# Patient Record
Sex: Female | Born: 1965 | Race: White | Hispanic: No | Smoking: Former smoker
Health system: Southern US, Community
[De-identification: ages and names within clinical notes are randomized; demographics above are authoritative.]

## PROBLEM LIST (undated history)

## (undated) DIAGNOSIS — Z309 Encounter for contraceptive management, unspecified: Secondary | ICD-10-CM

## (undated) DIAGNOSIS — K602 Anal fissure, unspecified: Secondary | ICD-10-CM

## (undated) DIAGNOSIS — R52 Pain, unspecified: Secondary | ICD-10-CM

## (undated) DIAGNOSIS — R42 Dizziness and giddiness: Secondary | ICD-10-CM

## (undated) DIAGNOSIS — K649 Unspecified hemorrhoids: Secondary | ICD-10-CM

## (undated) DIAGNOSIS — N390 Urinary tract infection, site not specified: Secondary | ICD-10-CM

## (undated) HISTORY — PX: COLONOSCOPY: SHX174

## (undated) HISTORY — DX: Unspecified hemorrhoids: K64.9

## (undated) HISTORY — DX: Encounter for contraceptive management, unspecified: Z30.9

## (undated) HISTORY — DX: Pain, unspecified: R52

## (undated) HISTORY — DX: Anal fissure, unspecified: K60.2

## (undated) HISTORY — DX: Urinary tract infection, site not specified: N39.0

## (undated) HISTORY — DX: Dizziness and giddiness: R42

---

## 2001-02-12 ENCOUNTER — Emergency Department (HOSPITAL_COMMUNITY): Admission: EM | Admit: 2001-02-12 | Discharge: 2001-02-13 | Payer: Self-pay | Admitting: Emergency Medicine

## 2001-02-12 ENCOUNTER — Encounter: Payer: Self-pay | Admitting: Emergency Medicine

## 2002-02-01 ENCOUNTER — Emergency Department (HOSPITAL_COMMUNITY): Admission: EM | Admit: 2002-02-01 | Discharge: 2002-02-01 | Payer: Self-pay | Admitting: Emergency Medicine

## 2004-12-12 ENCOUNTER — Emergency Department (HOSPITAL_COMMUNITY): Admission: EM | Admit: 2004-12-12 | Discharge: 2004-12-13 | Payer: Self-pay | Admitting: Emergency Medicine

## 2006-04-24 ENCOUNTER — Ambulatory Visit: Payer: Self-pay | Admitting: Gastroenterology

## 2006-05-09 ENCOUNTER — Ambulatory Visit: Payer: Self-pay | Admitting: Gastroenterology

## 2006-05-09 ENCOUNTER — Ambulatory Visit (HOSPITAL_COMMUNITY): Admission: RE | Admit: 2006-05-09 | Discharge: 2006-05-09 | Payer: Self-pay | Admitting: Gastroenterology

## 2007-09-16 ENCOUNTER — Other Ambulatory Visit: Admission: RE | Admit: 2007-09-16 | Discharge: 2007-09-16 | Payer: Self-pay | Admitting: Obstetrics and Gynecology

## 2008-09-16 ENCOUNTER — Other Ambulatory Visit: Admission: RE | Admit: 2008-09-16 | Discharge: 2008-09-16 | Payer: Self-pay | Admitting: Obstetrics and Gynecology

## 2009-03-31 ENCOUNTER — Ambulatory Visit (HOSPITAL_COMMUNITY): Admission: RE | Admit: 2009-03-31 | Discharge: 2009-03-31 | Payer: Self-pay | Admitting: Family Medicine

## 2009-10-04 ENCOUNTER — Other Ambulatory Visit: Admission: RE | Admit: 2009-10-04 | Discharge: 2009-10-04 | Payer: Self-pay | Admitting: Obstetrics and Gynecology

## 2009-11-26 ENCOUNTER — Ambulatory Visit (HOSPITAL_COMMUNITY): Admission: RE | Admit: 2009-11-26 | Discharge: 2009-11-26 | Payer: Self-pay | Admitting: Obstetrics & Gynecology

## 2009-12-22 ENCOUNTER — Ambulatory Visit (HOSPITAL_COMMUNITY)
Admission: RE | Admit: 2009-12-22 | Discharge: 2009-12-22 | Payer: Self-pay | Source: Home / Self Care | Attending: Obstetrics & Gynecology | Admitting: Obstetrics & Gynecology

## 2010-02-06 ENCOUNTER — Encounter: Payer: Self-pay | Admitting: Family Medicine

## 2010-06-03 NOTE — Consult Note (Signed)
NAME:  Flott, Latrece                ACCOUNT NO.:  [id number]   MEDICAL RECORD NO.:  [id number]           PATIENT TYPE:  AMB   LOCATION:                                FACILITY:  [facility]   PHYSICIAN:  Kassie Mends, M.D.      DATE OF BIRTH:  [birth year]   DATE OF CONSULTATION:  DATE OF DISCHARGE:                                 CONSULTATION   REQUESTING PHYSICIAN:  Dr. Lilyan Punt.   CHIEF COMPLAINT:  Intermittent hematochezia, proctalgia.   HISTORY OF PRESENT ILLNESS:  [name] is a 45 year old Caucasian  female who, over the last 3 years, has noticed intermittent small volume  hematochezia.  She says about 3 weeks ago, however, she developed severe  proctalgia.  She describes it as a ripping type pain with bowel  movement.  She was also having low abdominal sharp bilateral pelvic  pains which radiated to the rectum.  She says the pain resolved post  defecation.  She did notice small-volume pink blood on the toilet paper  with wiping.  She occasionally has hard stools.  Denies any straining.  She is having about two stools per day.  Her hemoglobin was checked and  normal at Dr. Fletcher Anon office per patient.  She denies any NSAID or  aspirin use.   PAST MEDICAL HISTORY:  Denies any past surgical history.  Denies any  current medications, Ortho Evra patch.   ALLERGIES:  KEFLEX, LEVAQUIN, CODEINE AND PENICILLIN.   FAMILY HISTORY:  There is no known family history of colorectal  carcinoma or inflammatory bowel disease.   SOCIAL HISTORY:  Ms. Ramaker is married.  She has one healthy son.  She  works at [employer].  She has a remote history tobacco use.  Denies  any alcohol or drug use.   REVIEW OF SYSTEMS:  CONSTITUTION:  Weight is stable.  Denies any fever  or chills.  Denies any fatigue.  CARDIOVASCULAR:  Denies chest pain or  palpitations.  PULMONARY:  Denies shortness of breath, dyspnea, cough,  hemoptysis.  GI:  See HPI.  Denies any nausea, vomiting, heartburn,  indigestion,  dysphagia, odynophagia, anorexia or early satiety.  HEMATOLOGICAL:  Denies any history of easy bruising, bleeding or blood  dyscrasias.  She does have a remote history of anemia, which was short-  lived, and she is unsure as to etiology why.  This was years ago.   PHYSICAL EXAM:  VITAL SIGNS:  Weight 137 pounds, height 61 inches,  temperature 98.4, blood pressure 112/82, and pulse 78.  GENERAL:  [name] [name] is a well-developed, well-nourished Caucasian  female in no acute distress.  HEENT:  Pupils are clear, nonicteric.  Conjunctivae pink.  Oropharynx pink and moist without lesions.  NECK:  Supple with no mass or thyromegaly.  CHEST:  Heart regular rate and rhythm, normal S1, S2, without murmurs,  clicks, rubs or gallops.  LUNGS:  Clear to auscultation bilaterally.  ABDOMEN:  Positive bowel sounds x4.  No bruits auscultated.  Soft,  nontender, nondistended,  without palpable mass or hepatosplenomegaly.  No rebound tenderness or guarding.  EXTREMITIES:  Without clubbing or edema bilaterally.  SKIN:  Pink, warm, and dry, without any rash or jaundice.   IMPRESSION:  [name] [name] is a 45 year old Caucasian female who has had a  3-year history of intermittent small volume hematochezia.  She now  presents with severe proctalgia with defecation as well as some  bilateral low abdominal sharp, stabbing pains.  She is going to require  further evaluation given her chronic, intermittent hematochezia to rule  out colorectal carcinoma, although it is possible she could have benign  anorectal source of her bleeding including hemorrhoid or fissure.   PLAN:  Colonoscopy with Dr. Cira Servant in the near future.  Has discussed  this procedure including risks and benefits including but not limited to  bleeding, infection, perforation, drug reaction.  She agrees to the plan  and consent will be obtained.   I would like to thank Dr. Gerda Diss for allowing [place] to participate in the  care of Ms. Gries.       Nicholas Lose, N.P.      Kassie Mends, M.D.  Electronically Signed    KC/MEDQ  D:  04/24/2006  T:  04/24/2006  Job:  [id number]   cc:   Lorin Picket A. Gerda Diss, MD  Fax: [phone]

## 2010-06-03 NOTE — Op Note (Signed)
NAME:  [name] [name]                ACCOUNT NO.:  [id number]   MEDICAL RECORD NO.:  [id number]          PATIENT TYPE:  AMB   LOCATION:  DAY                           FACILITY:  [facility]   PHYSICIAN:  Kassie Mends, M.D.      DATE OF BIRTH:  1965-02-25   DATE OF PROCEDURE:  05/09/2006  DATE OF DISCHARGE:                               OPERATIVE REPORT   PROCEDURE:  Colonoscopy.   ENDOSCOPIST:  Kassie Mends, M.D.   INDICATIONS FOR PROCEDURE:  [name] [name] is a 45 year old female who  presents with intermittent rectal bleeding and rectal pain, as well as  rectal itching.  Her symptoms developed at the beginning of April.   FINDINGS:  1. Normal colon without evidence of polyps, masses, inflammatory      changes, diverticula or arteriovenous malformations.  2. Normal retroflexed view of the rectum without evidence of internal      hemorrhoids.  The retroflexed view needed to be performed with a      diagnostic gastroscope due to a short rectum.   RECOMMENDATIONS:  1. [name] [name] may have intermittent rectal bleeding and rectal pain      due to an anal fissure.  She will be started on AnaMantle twice      daily for two weeks, and she may repeat if her rectal pain and      bleeding persist.  She will also be asked to use Colace 100 mg      b.i.d.  2. She will be asked to use a low-residue diet for four weeks.  She is      given a handout on a low-residue diet.  3. No aspirin or anti-inflammatory drugs for 14 days.  4. She will follow up with Dr. Kassie Mends in four weeks.  5. Screening colonoscopy in 10 years.   MEDICATIONS:  Demerol 75 mg IV, Versed 6 mg IV.   DESCRIPTION OF PROCEDURE:  Physical examination was performed and an  informed consent was obtained from the patient, after explaining the  benefits, risks and alternatives of the procedure.  The patient was  connected to the monitor and placed in the left lateral position.  Continuous oxygen provided by nasal cannula and IV  medicines  administered through an indwelling cannula.  After  administration of  sedation and rectal exam, the scope was  advanced under direct visualization to the cecum.  The scope was  subsequently removed slowly by carefully examining the color, texture,  anatomy and integrity of the mucosa on the way out.  The patient was  recovered in endoscopy and discharged home in satisfactory condition.      Kassie Mends, M.D.  Electronically Signed     SM/MEDQ  D:  05/09/2006  T:  05/09/2006  Job:  [id number]   cc:   Lorin Picket A. Gerda Diss, MD  Fax: [phone]

## 2010-11-08 ENCOUNTER — Other Ambulatory Visit (HOSPITAL_COMMUNITY)
Admission: RE | Admit: 2010-11-08 | Discharge: 2010-11-08 | Disposition: A | Discharge status: Home / Self Care | Payer: Managed Care, Other (non HMO) | Source: Ambulatory Visit | Attending: Obstetrics and Gynecology | Admitting: Obstetrics and Gynecology

## 2010-11-08 ENCOUNTER — Other Ambulatory Visit: Payer: Self-pay | Admitting: Adult Health

## 2010-11-08 DIAGNOSIS — Z01419 Encounter for gynecological examination (general) (routine) without abnormal findings: Secondary | ICD-10-CM | POA: Insufficient documentation

## 2011-11-10 ENCOUNTER — Other Ambulatory Visit: Payer: Self-pay | Admitting: Adult Health

## 2011-11-10 ENCOUNTER — Other Ambulatory Visit (HOSPITAL_COMMUNITY)
Admission: RE | Admit: 2011-11-10 | Discharge: 2011-11-10 | Disposition: A | Discharge status: Home / Self Care | Payer: Managed Care, Other (non HMO) | Source: Ambulatory Visit | Attending: Obstetrics and Gynecology | Admitting: Obstetrics and Gynecology

## 2011-11-10 DIAGNOSIS — Z1151 Encounter for screening for human papillomavirus (HPV): Secondary | ICD-10-CM | POA: Insufficient documentation

## 2011-11-10 DIAGNOSIS — Z113 Encounter for screening for infections with a predominantly sexual mode of transmission: Secondary | ICD-10-CM | POA: Insufficient documentation

## 2011-11-10 DIAGNOSIS — Z01419 Encounter for gynecological examination (general) (routine) without abnormal findings: Secondary | ICD-10-CM | POA: Insufficient documentation

## 2011-11-10 DIAGNOSIS — Z139 Encounter for screening, unspecified: Secondary | ICD-10-CM

## 2011-11-20 ENCOUNTER — Ambulatory Visit (HOSPITAL_COMMUNITY)
Admission: RE | Admit: 2011-11-20 | Discharge: 2011-11-20 | Disposition: A | Discharge status: Home / Self Care | Payer: Managed Care, Other (non HMO) | Source: Ambulatory Visit | Attending: Adult Health | Admitting: Adult Health

## 2011-11-20 DIAGNOSIS — Z1231 Encounter for screening mammogram for malignant neoplasm of breast: Secondary | ICD-10-CM | POA: Insufficient documentation

## 2011-11-20 DIAGNOSIS — Z139 Encounter for screening, unspecified: Secondary | ICD-10-CM

## 2012-08-15 ENCOUNTER — Other Ambulatory Visit: Payer: Self-pay | Admitting: Adult Health

## 2012-08-21 ENCOUNTER — Other Ambulatory Visit: Payer: Self-pay | Admitting: Adult Health

## 2012-08-28 ENCOUNTER — Other Ambulatory Visit: Payer: Self-pay | Admitting: Family Medicine

## 2012-08-28 ENCOUNTER — Encounter: Payer: Self-pay | Admitting: Advanced Practice Midwife

## 2012-08-28 ENCOUNTER — Ambulatory Visit (INDEPENDENT_AMBULATORY_CARE_PROVIDER_SITE_OTHER): Payer: Managed Care, Other (non HMO) | Admitting: Advanced Practice Midwife

## 2012-08-28 VITALS — BP 116/76 | Ht 61.0 in | Wt 130.5 lb

## 2012-08-28 DIAGNOSIS — B373 Candidiasis of vulva and vagina: Secondary | ICD-10-CM

## 2012-08-28 DIAGNOSIS — B3789 Other sites of candidiasis: Secondary | ICD-10-CM

## 2012-08-28 DIAGNOSIS — N760 Acute vaginitis: Secondary | ICD-10-CM

## 2012-08-28 DIAGNOSIS — R350 Frequency of micturition: Secondary | ICD-10-CM

## 2012-08-28 LAB — POCT URINALYSIS DIPSTICK
Blood, UA: NEGATIVE
Glucose, UA: NEGATIVE

## 2012-08-28 MED ORDER — CLINDAMYCIN PHOSPHATE 100 MG VA SUPP: 100.0000 mg | Freq: Every day | VAGINAL | Status: DC

## 2012-08-28 MED ORDER — FLUCONAZOLE 150 MG PO TABS: 150.0000 mg | ORAL_TABLET | Freq: Once | ORAL | Status: DC

## 2012-08-28 NOTE — Progress Notes (Signed)
SUBJECTIVE:  47 y.o. female complains of white, thin and vulvar erythema noted vaginal discharge for 4  day(s). Denies abnormal vaginal bleeding or significant pelvic pain or Fever. C/O dysuria/pressure which resolved today (after 3 days of macrobid). Denies history of known exposure to STD.  Patient's last menstrual period was 07/28/2012.  OBJECTIVE:  She appears well, afebrile. Abdomen: benign, soft, nontender, no masses. Pelvic Exam:Vulva very erythemous, WET MOUNT done - results: clue cells, excessive bacteria. Urine dipstick: negative for all components. Pt has rx fro Macrobid tht she started 3 days ago  ASSESSMENT:  bacterial vaginosis  PLAN:   Treatment: Cleocin Ovule at bedtime x 3 days and abstain from coitus during course of treatment.  Fininsh Macrobid;.  Prn DIFLUCAN  sent   ROV prn if symptoms persist or worsen.

## 2012-09-22 ENCOUNTER — Other Ambulatory Visit: Payer: Self-pay | Admitting: Family Medicine

## 2012-10-09 ENCOUNTER — Other Ambulatory Visit: Payer: Self-pay | Admitting: Adult Health

## 2012-11-13 ENCOUNTER — Ambulatory Visit (INDEPENDENT_AMBULATORY_CARE_PROVIDER_SITE_OTHER): Payer: Managed Care, Other (non HMO) | Admitting: Adult Health

## 2012-11-13 ENCOUNTER — Encounter (INDEPENDENT_AMBULATORY_CARE_PROVIDER_SITE_OTHER): Payer: Self-pay

## 2012-11-13 ENCOUNTER — Encounter: Payer: Self-pay | Admitting: Adult Health

## 2012-11-13 VITALS — BP 122/70 | HR 78 | Ht 60.0 in | Wt 126.0 lb

## 2012-11-13 DIAGNOSIS — Z309 Encounter for contraceptive management, unspecified: Secondary | ICD-10-CM

## 2012-11-13 DIAGNOSIS — Z01419 Encounter for gynecological examination (general) (routine) without abnormal findings: Secondary | ICD-10-CM

## 2012-11-13 DIAGNOSIS — Z1212 Encounter for screening for malignant neoplasm of rectum: Secondary | ICD-10-CM

## 2012-11-13 HISTORY — DX: Encounter for contraceptive management, unspecified: Z30.9

## 2012-11-13 LAB — HEMOCCULT GUIAC POC 1CARD (OFFICE): Fecal Occult Blood, POC: NEGATIVE

## 2012-11-13 MED ORDER — ACYCLOVIR 5 % EX CREA: 1.0000 "application " | TOPICAL_CREAM | CUTANEOUS | Status: DC

## 2012-11-13 MED ORDER — NORELGESTROMIN-ETH ESTRADIOL 150-35 MCG/24HR TD PTWK: 1.0000 | MEDICATED_PATCH | TRANSDERMAL | Status: DC

## 2012-11-13 NOTE — Progress Notes (Signed)
Patient ID: [name] [name], female   DOB: [birth year], 47 y.o.   MRN: [id number] History of Present Illness: [name] [name] is a 47 year old white female in for physical, she had a normal pap with negative HPV 11/10/11. Happy with patch.  Current Medications, Allergies, Past Medical History, Past Surgical History, Family History and Social History were reviewed in [employer] record.     Review of Systems: Patient denies any headaches, blurred vision, shortness of breath, chest pain, abdominal pain, problems with bowel movements, urination, or intercourse. No joint pain has chronic back pain, no mood swings, has noticed vaginal itch after sex and does not feel as much with sex.    Physical Exam:BP 122/70  Pulse 78  Ht 5' (1.524 m)  Wt 126 lb (57.153 kg)  BMI 24.61 kg/m2  LMP 10/20/2012 General:  Well developed, well nourished, no acute distress Skin:  Warm and dry Neck:  Midline trachea, normal thyroid Lungs; Clear to auscultation bilaterally Breast:  No dominant palpable mass, retraction, or nipple discharge Cardiovascular: Regular rate and rhythm Abdomen:  Soft, non tender, no hepatosplenomegaly Pelvic:  External genitalia is normal in appearance. Has sebaceous cyst right labia. The vagina is normal in appearance.  The cervix is bulbous.  Uterus is felt to be normal size, shape, and contour.  No   adnexal masses or tenderness noted. Rectal: Good sphincter tone, no polyps, or hemorrhoids felt.  Hemoccult negative. Extremities:  No swelling or varicosities noted Psych:  No mood changes, alert and cooperative   Impression: Yearly gyn exam no pap Contraceptive management    Plan: Physical in 1 year  Mammogram yearly Check fasting labs in near future, CBC,CMP,TSH and lipids Colonoscopy at 50 Refilled ortho evra x 1 year  Use lubricant and increase foreplay

## 2012-11-13 NOTE — Patient Instructions (Signed)
Physical in 1 year Mammogram yearly Colonoscopy at 50  

## 2012-11-26 ENCOUNTER — Other Ambulatory Visit: Payer: Self-pay | Admitting: Adult Health

## 2012-11-26 DIAGNOSIS — Z139 Encounter for screening, unspecified: Secondary | ICD-10-CM

## 2012-12-03 ENCOUNTER — Ambulatory Visit (HOSPITAL_COMMUNITY)
Admission: RE | Admit: 2012-12-03 | Discharge: 2012-12-03 | Disposition: A | Discharge status: Home / Self Care | Payer: Managed Care, Other (non HMO) | Source: Ambulatory Visit | Attending: Adult Health | Admitting: Adult Health

## 2012-12-03 DIAGNOSIS — Z1231 Encounter for screening mammogram for malignant neoplasm of breast: Secondary | ICD-10-CM | POA: Insufficient documentation

## 2012-12-03 DIAGNOSIS — Z139 Encounter for screening, unspecified: Secondary | ICD-10-CM

## 2012-12-05 ENCOUNTER — Ambulatory Visit (INDEPENDENT_AMBULATORY_CARE_PROVIDER_SITE_OTHER): Payer: Managed Care, Other (non HMO) | Admitting: Nurse Practitioner

## 2012-12-05 ENCOUNTER — Encounter: Payer: Self-pay | Admitting: Nurse Practitioner

## 2012-12-05 VITALS — BP 122/88 | Temp 98.2°F | Ht 60.0 in | Wt 126.0 lb

## 2012-12-05 DIAGNOSIS — J310 Chronic rhinitis: Secondary | ICD-10-CM

## 2012-12-05 MED ORDER — METHYLPREDNISOLONE ACETATE 40 MG/ML IJ SUSP
40.0000 mg | Freq: Once | INTRAMUSCULAR | Status: AC
  Administered 2012-12-05: 40 mg via INTRAMUSCULAR

## 2012-12-05 MED ORDER — FLUCONAZOLE 150 MG PO TABS: ORAL_TABLET | ORAL | Status: DC

## 2012-12-05 MED ORDER — AZITHROMYCIN 250 MG PO TABS: ORAL_TABLET | ORAL | Status: DC

## 2012-12-05 NOTE — Patient Instructions (Signed)
Nasacort AQ as directed OTC anthistamine

## 2012-12-07 ENCOUNTER — Encounter: Payer: Self-pay | Admitting: Nurse Practitioner

## 2012-12-07 NOTE — Progress Notes (Signed)
Subjective:  Presents with complaints of sinus congestion for the past 5 days. No fever. Frequent nonproductive cough. Green nasal drainage. No headache sore throat. Some ear pressure. No wheezing.  Objective:   BP 122/88  Temp(Src) 98.2 F (36.8 C) (Oral)  Ht 5' (1.524 m)  Wt 126 lb (57.153 kg)  BMI 24.61 kg/m2  LMP 11/18/2012 NAD. Alert, oriented. TMs clear effusion, no erythema. Pharynx injected with green PND noted. Neck supple with mild soft nontender adenopathy. Lungs clear. Heart regular rate rhythm.   Assessment:Rhinitis - Plan: methylPREDNISolone acetate (DEPO-MEDROL) injection 40 mg  Plan: Meds ordered this encounter  Medications  . DISCONTD: fluconazole (DIFLUCAN) 150 MG tablet    Sig:   . azithromycin (ZITHROMAX Z-PAK) 250 MG tablet    Sig: Take 2 tablets (500 mg) on  Day 1,  followed by 1 tablet (250 mg) once daily on Days 2 through 5.    Dispense:  6 each    Refill:  0    Order Specific Question:  Supervising Provider    Answer:  Merlyn Albert [[id number]]  . fluconazole (DIFLUCAN) 150 MG tablet    Sig: One po qd prn yeast infection; may repeat in 3-4 days if needed    Dispense:  2 tablet    Refill:  0    Order Specific Question:  Supervising Provider    Answer:  Merlyn Albert [[id number]]  . methylPREDNISolone acetate (DEPO-MEDROL) injection 40 mg    Sig:    OTC meds as directed for congestion. OTC antihistamines as directed. Nasacort AQ as directed. Call back if symptoms worsen or persist.

## 2012-12-09 ENCOUNTER — Other Ambulatory Visit: Payer: Self-pay | Admitting: Adult Health

## 2012-12-09 DIAGNOSIS — R928 Other abnormal and inconclusive findings on diagnostic imaging of breast: Secondary | ICD-10-CM

## 2012-12-17 ENCOUNTER — Other Ambulatory Visit: Payer: Self-pay | Admitting: Adult Health

## 2012-12-17 ENCOUNTER — Other Ambulatory Visit: Payer: Managed Care, Other (non HMO)

## 2012-12-17 LAB — LIPID PANEL
Cholesterol: 140 mg/dL (ref 0–200)
HDL: 58 mg/dL (ref >39)
LDL Cholesterol: 59 mg/dL (ref 0–99)
Total CHOL/HDL Ratio: 2.4 Ratio
Triglycerides: 113 mg/dL (ref <150)
VLDL: 23 mg/dL (ref 0–40)

## 2012-12-17 LAB — COMPREHENSIVE METABOLIC PANEL
Calcium: 9.3 mg/dL (ref 8.4–10.5)
Glucose, Bld: 82 mg/dL (ref 70–99)
Total Bilirubin: 0.5 mg/dL (ref 0.3–1.2)
Total Protein: 6.8 g/dL (ref 6.0–8.3)

## 2012-12-17 LAB — CBC
HCT: 37 % (ref 36.0–46.0)
Hemoglobin: 12.5 g/dL (ref 12.0–15.0)
WBC: 5 10*3/uL (ref 4.0–10.5)

## 2012-12-17 LAB — TSH: TSH: 0.602 u[IU]/mL (ref 0.350–4.500)

## 2013-01-01 ENCOUNTER — Ambulatory Visit (HOSPITAL_COMMUNITY)
Admission: RE | Admit: 2013-01-01 | Discharge: 2013-01-01 | Disposition: A | Discharge status: Home / Self Care | Payer: Managed Care, Other (non HMO) | Source: Ambulatory Visit | Attending: Adult Health | Admitting: Adult Health

## 2013-01-01 DIAGNOSIS — R928 Other abnormal and inconclusive findings on diagnostic imaging of breast: Secondary | ICD-10-CM

## 2013-03-18 ENCOUNTER — Other Ambulatory Visit: Payer: Self-pay | Admitting: Family Medicine

## 2013-03-21 ENCOUNTER — Other Ambulatory Visit: Payer: Self-pay | Admitting: Adult Health

## 2013-05-01 ENCOUNTER — Other Ambulatory Visit: Payer: Self-pay | Admitting: Family Medicine

## 2013-05-02 NOTE — Telephone Encounter (Signed)
Last seen on 11/14 for sickness

## 2013-05-04 NOTE — Telephone Encounter (Signed)
Refill x3, she will need office visit before further refills

## 2013-05-14 ENCOUNTER — Telehealth: Payer: Self-pay | Admitting: Adult Health

## 2013-05-14 MED ORDER — FLUCONAZOLE 150 MG PO TABS: ORAL_TABLET | ORAL | Status: DC

## 2013-05-14 NOTE — Telephone Encounter (Signed)
Left message refilled diflucan

## 2013-05-19 ENCOUNTER — Ambulatory Visit (INDEPENDENT_AMBULATORY_CARE_PROVIDER_SITE_OTHER): Payer: Managed Care, Other (non HMO) | Admitting: Nurse Practitioner

## 2013-05-19 ENCOUNTER — Encounter: Payer: Self-pay | Admitting: Nurse Practitioner

## 2013-05-19 VITALS — BP 128/76 | Temp 98.2°F | Ht 61.0 in | Wt 128.0 lb

## 2013-05-19 DIAGNOSIS — R102 Pelvic and perineal pain: Secondary | ICD-10-CM

## 2013-05-19 DIAGNOSIS — R300 Dysuria: Secondary | ICD-10-CM

## 2013-05-19 LAB — POCT URINALYSIS DIPSTICK
PH UA: 5
SPEC GRAV UA: 1.015

## 2013-05-19 LAB — POCT UA - MICROSCOPIC ONLY
Bacteria, U Microscopic: NEGATIVE
RBC, URINE, MICROSCOPIC: NEGATIVE

## 2013-05-19 MED ORDER — SULFAMETHOXAZOLE-TMP DS 800-160 MG PO TABS: 1.0000 | ORAL_TABLET | Freq: Two times a day (BID) | ORAL | Status: DC

## 2013-05-19 NOTE — Progress Notes (Signed)
Subjective:  Presents for complaints of urinary urgency and frequency for the past 2 weeks. Dysuria. Tenderness around the left mid back area. Has had problems off and on with urinary symptoms particularly after intercourse. Was given prescription for Macrobid to take if needed after intercourse, some relief but did not resolve symptoms this time. No relief with Diflucan. Had some external burning, used Monistat which seemed to make it worse. Patient is having some spotting today, just completed a normal menstrual cycle. Otherwise no vaginal discharge. Same sexual partner for the past 3 years, will occasionally use condoms. No fever. Nausea but no vomiting.  Objective:   BP 128/76  Temp(Src) 98.2 F (36.8 C) (Oral)  Ht 5\' 1"  (1.549 m)  Wt 128 lb (58.06 kg)  BMI 24.20 kg/m2 NAD. Alert, oriented. Lungs clear. Minimal left CVA area tenderness. Heart regular rate rhythm. Abdomen soft nondistended with mild mid pelvic area tenderness. GU exam deferred since patient is still bleeding. Results for orders placed in visit on 05/19/13  POCT URINALYSIS DIPSTICK      Result Value Ref Range   Color, UA       Clarity, UA       Glucose, UA       Bilirubin, UA +     Ketones, UA       Spec Grav, UA 1.015     Blood, UA       pH, UA 5.0     Protein, UA       Urobilinogen, UA       Nitrite, UA       Leukocytes, UA      POCT UA - MICROSCOPIC ONLY      Result Value Ref Range   WBC, Ur, HPF, POC rare     RBC, urine, microscopic neg     Bacteria, U Microscopic neg     Mucus, UA       Epithelial cells, urine per micros rare     Crystals, Ur, HPF, POC       Casts, Ur, LPF, POC       Yeast, UA         Assessment:Dysuria - Plan: POCT urinalysis dipstick, POCT UA - Microscopic Only, Urine culture, GC/chlamydia probe amp, urine  Pelvic pain - Plan: Urine culture, GC/chlamydia probe amp, urine  Plan: Meds ordered this encounter  Medications  . loratadine (CLARITIN) 10 MG tablet    Sig: Take 10 mg by  mouth daily.  . Multiple Vitamin (MULTIVITAMIN) tablet    Sig: Take 1 tablet by mouth daily.  . naproxen sodium (ANAPROX) 220 MG tablet    Sig: Take 220 mg by mouth as needed.  . sulfamethoxazole-trimethoprim (BACTRIM DS) 800-160 MG per tablet    Sig: Take 1 tablet by mouth 2 (two) times daily.    Dispense:  14 tablet    Refill:  0    Order Specific Question:  Supervising Provider    Answer:  Mikey Kirschner [[id number]]   Urine culture pending. Also urine for GC and Chlamydia due to persistent symptoms. Warning signs reviewed. Call back in 48-72 hours if no improvement, sooner if worse. Recheck with gynecology if vaginal symptoms continue.

## 2013-05-20 LAB — GC/CHLAMYDIA PROBE AMP, URINE
CHLAMYDIA, SWAB/URINE, PCR: NEGATIVE
GC PROBE AMP, URINE: NEGATIVE

## 2013-05-22 LAB — URINE CULTURE: Colony Count: 45000

## 2013-06-03 ENCOUNTER — Other Ambulatory Visit: Payer: Self-pay | Admitting: Nurse Practitioner

## 2013-06-03 NOTE — Telephone Encounter (Signed)
Please confirm with patient why she takes Neurontin. She may have this +3 additional refills but she would need to have a followup office visit later this summer in regards to the Neurontin

## 2013-06-05 NOTE — Telephone Encounter (Signed)
May refill x3

## 2013-07-03 ENCOUNTER — Ambulatory Visit (INDEPENDENT_AMBULATORY_CARE_PROVIDER_SITE_OTHER): Payer: Managed Care, Other (non HMO) | Admitting: Family Medicine

## 2013-07-03 ENCOUNTER — Encounter: Payer: Self-pay | Admitting: Family Medicine

## 2013-07-03 VITALS — BP 120/78 | Ht 61.0 in | Wt 131.6 lb

## 2013-07-03 DIAGNOSIS — M6580 Other synovitis and tenosynovitis, unspecified site: Secondary | ICD-10-CM

## 2013-07-03 DIAGNOSIS — M722 Plantar fascial fibromatosis: Secondary | ICD-10-CM

## 2013-07-03 DIAGNOSIS — M76891 Other specified enthesopathies of right lower limb, excluding foot: Secondary | ICD-10-CM

## 2013-07-03 MED ORDER — KETOCONAZOLE 2 % EX CREA: 1.0000 "application " | TOPICAL_CREAM | Freq: Two times a day (BID) | CUTANEOUS | Status: AC

## 2013-07-03 MED ORDER — TRIAMCINOLONE ACETONIDE 0.1 % EX CREA: TOPICAL_CREAM | CUTANEOUS | Status: DC

## 2013-07-03 MED ORDER — DICLOFENAC SODIUM ER 100 MG PO TB24: 100.0000 mg | ORAL_TABLET | Freq: Every day | ORAL | Status: DC

## 2013-07-03 NOTE — Patient Instructions (Signed)
Plantar Fasciitis  Plantar fasciitis is a common condition that causes foot pain. It is soreness (inflammation) of the band of tough fibrous tissue on the bottom of the foot that runs from the heel bone (calcaneus) to the ball of the foot. The cause of this soreness may be from excessive standing, poor fitting shoes, running on hard surfaces, being overweight, having an abnormal walk, or overuse (this is common in runners) of the painful foot or feet. It is also common in aerobic exercise dancers and ballet dancers.  SYMPTOMS   Most people with plantar fasciitis complain of:   Severe pain in the morning on the bottom of their foot especially when taking the first steps out of bed. This pain recedes after a few minutes of walking.   Severe pain is experienced also during walking following a long period of inactivity.   Pain is worse when walking barefoot or up stairs  DIAGNOSIS    Your caregiver will diagnose this condition by examining and feeling your foot.   Special tests such as X-rays of your foot, are usually not needed.  PREVENTION    Consult a sports medicine professional before beginning a new exercise program.   Walking programs offer a good workout. With walking there is a lower [place] of overuse injuries common to runners. There is less impact and less jarring of the joints.   Begin all new exercise programs slowly. If problems or pain develop, decrease the amount of time or distance until you are at a comfortable level.   Wear good shoes and replace them regularly.   Stretch your foot and the heel cords at the back of the ankle (Achilles tendon) both before and after exercise.   Run or exercise on even surfaces that are not hard. For example, asphalt is better than pavement.   Do not run barefoot on hard surfaces.   If using a treadmill, vary the incline.   Do not continue to workout if you have foot or joint problems. Seek professional help if they do not improve.  HOME CARE INSTRUCTIONS     Avoid activities that cause you pain until you recover.   Use ice or cold packs on the problem or painful areas after working out.   Only take over-the-counter or prescription medicines for pain, discomfort, or fever as directed by your caregiver.   Soft shoe inserts or athletic shoes with air or gel sole cushions may be helpful.   If problems continue or become more severe, consult a sports medicine caregiver or your own health care [name] [name]. Cortisone is a potent anti-inflammatory medication that may be injected into the painful area. You can discuss this treatment with your caregiver.  MAKE SURE YOU:    Understand these instructions.   Will watch your condition.   Will get help right away if you are not doing well or get worse.  Document Released: 09/27/2000 Document Revised: 03/27/2011 Document Reviewed: 11/27/2007  ExitCare Patient Information 2015 [employer]. This information is not intended to replace advice given to you by your health care [name] [name]. Make sure you discuss any questions you have with your health care [name] [name].

## 2013-07-03 NOTE — Progress Notes (Signed)
   Subjective:    Patient ID: [name] [name], female    DOB: [birth year], 48 y.o.   MRN: [id number]  HPI Patient arrives with complaint of right knee pain and swelling for one week. Patient also having heel pain. No previous injury Pops when she walks Swelling started Sunday Right knee was very swollen but now inproving Some cramps in the right leg occasionally Can't squat Cant fully flex Been walking 1 lap at [facility] 3 times a week  Bilateral heel pain for 1 month  r hip region giving some discomfort as well   Review of Systems See above negative for fevers    Objective:   Physical Exam Ankles are normal bilateral plantar fasciitis worse on the right than the left knee exam slight crepitus noted on the right side a little bit of swelling ligaments are stable half normal thigh normal       Assessment & Plan:  This patient has overuse injury of the right knee may be starting to have some osteoarthritis issues but I don't believe there is any sign of ligament or cartilage damage I don't recommend x-ray.  I recommend anti-inflammatories on a regular basis if ongoing troubles followup  Stretching exercises for the foot cold compresses anti-inflammatories he does not get better notify us we will help set up with orthopedics  There is some discrepancy of her leg length I recommended setting her up with orthopedics she does not want to do this currently she will let us know

## 2013-07-11 ENCOUNTER — Telehealth: Payer: Self-pay | Admitting: Family Medicine

## 2013-07-11 MED ORDER — ETODOLAC 400 MG PO TABS: 400.0000 mg | ORAL_TABLET | Freq: Two times a day (BID) | ORAL | Status: DC

## 2013-07-11 NOTE — Telephone Encounter (Signed)
Diclofenac Sodium CR 100 MG 24 hr tablet  Pt was issued this script on 07/03/13 and  Is stating that it is making her stomach upset As well as giving her headaches  Wants to switch to something else please   [facility]

## 2013-07-11 NOTE — Telephone Encounter (Signed)
lodine 400 numb 28 one bid with fod/ pt should also add omeprazole 20 otc daily or something similar if not already on

## 2013-07-14 NOTE — Telephone Encounter (Signed)
Prior message was left on 07/11/13 by Arbutus Ped for patient to return call. I left message on voicemail notifying patient med was sent to pharmacy and to try Omeprazole 20 mg otc.

## 2013-07-27 ENCOUNTER — Other Ambulatory Visit: Payer: Self-pay | Admitting: Adult Health

## 2013-10-30 ENCOUNTER — Other Ambulatory Visit: Payer: Self-pay | Admitting: Adult Health

## 2013-11-17 ENCOUNTER — Encounter: Payer: Self-pay | Admitting: Family Medicine

## 2013-11-26 ENCOUNTER — Other Ambulatory Visit: Payer: Managed Care, Other (non HMO) | Admitting: Adult Health

## 2014-03-19 ENCOUNTER — Other Ambulatory Visit: Payer: Self-pay | Admitting: Family Medicine

## 2014-03-24 ENCOUNTER — Ambulatory Visit (INDEPENDENT_AMBULATORY_CARE_PROVIDER_SITE_OTHER): Payer: Managed Care, Other (non HMO) | Admitting: Family Medicine

## 2014-03-24 ENCOUNTER — Encounter: Payer: Self-pay | Admitting: Family Medicine

## 2014-03-24 VITALS — BP 130/70 | Temp 98.6°F | Ht 61.0 in | Wt 131.4 lb

## 2014-03-24 DIAGNOSIS — J111 Influenza due to unidentified influenza virus with other respiratory manifestations: Secondary | ICD-10-CM | POA: Diagnosis not present

## 2014-03-24 MED ORDER — OSELTAMIVIR PHOSPHATE 75 MG PO CAPS: 75.0000 mg | ORAL_CAPSULE | Freq: Two times a day (BID) | ORAL | Status: DC

## 2014-03-24 NOTE — Patient Instructions (Signed)

## 2014-03-24 NOTE — Progress Notes (Signed)
   Subjective:    Patient ID: [name] [name], female    DOB: [birth year], 49 y.o.   MRN: [id number]  Sinusitis This is a new problem. The current episode started yesterday. The problem is unchanged. Associated symptoms include congestion, coughing, ear pain, headaches and a sore throat. (Fever) Treatments tried: ibuprofen. The treatment provided no relief.   No other concerns at this time.    Review of Systems  HENT: Positive for congestion, ear pain and sore throat.   Respiratory: Positive for cough.   Neurological: Positive for headaches.       Objective:   Physical Exam  Throat nonerythematous mucous membranes moist neck is supple lungs clear heart regular      Assessment & Plan:  Influenza Tamiflu as directed Warning signs discuss  Influenza-the patient was diagnosed with influenza. Patient/family educated about the flu and warning signs to watch for. If difficulty breathing, severe neck pain and stiffness, cyanosis, disorientation, or progressive worsening then immediately get rechecked at that ER. If progressive symptoms be certain to be rechecked. Supportive measures such as Tylenol/ibuprofen was discussed. No aspirin use in children. And influenza home care instruction sheet was given.

## 2014-03-30 ENCOUNTER — Encounter: Payer: Self-pay | Admitting: Adult Health

## 2014-03-30 ENCOUNTER — Ambulatory Visit (INDEPENDENT_AMBULATORY_CARE_PROVIDER_SITE_OTHER): Payer: Managed Care, Other (non HMO) | Admitting: Adult Health

## 2014-03-30 VITALS — BP 112/68 | HR 63 | Ht 60.25 in | Wt 129.5 lb

## 2014-03-30 DIAGNOSIS — Z01419 Encounter for gynecological examination (general) (routine) without abnormal findings: Secondary | ICD-10-CM

## 2014-03-30 DIAGNOSIS — Z3040 Encounter for surveillance of contraceptives, unspecified: Secondary | ICD-10-CM

## 2014-03-30 DIAGNOSIS — K602 Anal fissure, unspecified: Secondary | ICD-10-CM | POA: Insufficient documentation

## 2014-03-30 HISTORY — DX: Anal fissure, unspecified: K60.2

## 2014-03-30 MED ORDER — PRAMOXINE-HC 1-1 % EX CREA: TOPICAL_CREAM | Freq: Three times a day (TID) | CUTANEOUS | Status: DC

## 2014-03-30 MED ORDER — NORELGESTROMIN-ETH ESTRADIOL 150-35 MCG/24HR TD PTWK: MEDICATED_PATCH | TRANSDERMAL | Status: DC

## 2014-03-30 MED ORDER — NITROFURANTOIN MONOHYD MACRO 100 MG PO CAPS: ORAL_CAPSULE | ORAL | Status: DC

## 2014-03-30 NOTE — Progress Notes (Addendum)
Patient ID: CALI HOPE, female   DOB: 11-19-1965, 49 y.o.   MRN: [id number] History of Present Illness: [name] [name] is a  49 year old white female in for well woman gyn exam.She had a normal pap with negative HPV 11/10/11.She says she had the flu last week and has cough still and nasal congestion.Has ?fissure has pain with BM at times.She is happy with the patch.   Current Medications, Allergies, Past Medical History, Past Surgical History, Family History and Social History were reviewed in [employer] record.     Review of Systems: Patient denies any headaches, hearing loss, fatigue, blurred vision, shortness of breath, chest pain, abdominal pain, problems with bowel movements, urination, or intercourse. No joint pain or mood swings.See HPI for positives.    Physical Exam:BP 112/68 mmHg  Pulse 63  Ht 5' 0.25" (1.53 m)  Wt 129 lb 8 oz (58.741 kg)  BMI 25.09 kg/m2  LMP 03/09/2014 General:  Well developed, well nourished, no acute distress Skin:  Warm and dry Neck:  Midline trachea, normal thyroid, good ROM, no lymphadenopathy Lungs; Clear to auscultation bilaterally Breast:  No dominant palpable mass, retraction, or nipple discharge, has inverted nipples bilaterally  Cardiovascular: Regular rate and rhythm Abdomen:  Soft, non tender, no hepatosplenomegaly Pelvic:  External genitalia is normal in appearance, no lesions.  The vagina is normal in appearance. Urethra has no lesions or masses. The cervix is bulbous.  Uterus is felt to be normal size, shape, and contour.  No adnexal masses or tenderness noted.Bladder is non tender, no masses felt. Rectal: Good sphincter tone, no polyps, or hemorrhoids felt.  Hemoccult negative.?fissure on left at 1 o'clock Extremities/musculoskeletal:  No swelling or varicosities noted, no clubbing or cyanosis Psych:  No mood changes, alert and cooperative,seems happy   Impression: Well woman gyn exam no pap Contraceptive  management Rectal fissure    Plan: Refilled orth evra patch x 1 year Refilled macrobid #30 1 after sex with 1 refill Try zyrtec D or claritin D and continue delsym if not better call PCP Pap and physical in 1 year Mammogram now and yearly  Rx analpram HC use 1-2 x daily to fissure

## 2014-03-30 NOTE — Patient Instructions (Signed)
Pap and physical  In  1 year  Mammogram yearly Try zyrtec D or claritin D

## 2014-06-08 ENCOUNTER — Other Ambulatory Visit: Payer: Self-pay | Admitting: Obstetrics & Gynecology

## 2014-06-08 DIAGNOSIS — Z1231 Encounter for screening mammogram for malignant neoplasm of breast: Secondary | ICD-10-CM

## 2014-06-22 ENCOUNTER — Ambulatory Visit (HOSPITAL_COMMUNITY): Payer: Managed Care, Other (non HMO)

## 2014-06-24 ENCOUNTER — Encounter: Payer: Self-pay | Admitting: Nurse Practitioner

## 2014-06-24 ENCOUNTER — Ambulatory Visit (INDEPENDENT_AMBULATORY_CARE_PROVIDER_SITE_OTHER): Payer: Managed Care, Other (non HMO) | Admitting: Nurse Practitioner

## 2014-06-24 VITALS — BP 120/78 | Temp 98.3°F | Ht 61.0 in | Wt 125.1 lb

## 2014-06-24 DIAGNOSIS — Z139 Encounter for screening, unspecified: Secondary | ICD-10-CM | POA: Diagnosis not present

## 2014-06-24 DIAGNOSIS — N390 Urinary tract infection, site not specified: Secondary | ICD-10-CM

## 2014-06-24 LAB — POCT UA - MICROSCOPIC ONLY: RBC, urine, microscopic: NEGATIVE

## 2014-06-24 LAB — POCT URINALYSIS DIPSTICK
Blood, UA: POSITIVE
PH UA: 5
PROTEIN UA: NEGATIVE
Spec Grav, UA: 1.02

## 2014-06-24 MED ORDER — SULFAMETHOXAZOLE-TRIMETHOPRIM 800-160 MG PO TABS: 1.0000 | ORAL_TABLET | Freq: Two times a day (BID) | ORAL | Status: DC

## 2014-06-24 MED ORDER — FLUCONAZOLE 150 MG PO TABS: ORAL_TABLET | ORAL | Status: DC

## 2014-06-24 NOTE — Progress Notes (Signed)
Subjective:  Presents for c/o dysuria, urgency and frequency x 1 week. Much worse 2 days ago. Take Macrobid after intercourse to prevent UTI. Took 2 pills on Monday; slight improvement of symptoms. Was going "every 5 minutes" that day. No fever. No N/V. No back or flank pain. Due to start her cycle; uses BC patch. Same sexual partner x 4 years. Also has developed a yeast infection over the past 2 days with itching, burning and thick white discharge.   Objective:   BP 120/78 mmHg  Temp(Src) 98.3 F (36.8 C) (Oral)  Ht 5\' 1"  (1.549 m)  Wt 125 lb 2 oz (56.756 kg)  BMI 23.65 kg/m2  LMP 06/03/2014 NAD. Alert, oriented. Lungs clear. Heart RRR. Abdomen soft, non distended, non tender. No CVA or flank tenderness. Results for orders placed or performed in visit on 06/24/14  POCT urinalysis dipstick  Result Value Ref Range   Color, UA Yellow    Clarity, UA     Glucose, UA     Bilirubin, UA     Ketones, UA     Spec Grav, UA 1.020    Blood, UA positive    pH, UA 5.0    Protein, UA negative    Urobilinogen, UA     Nitrite, UA     Leukocytes, UA large (3+)   POCT UA - Microscopic Only  Result Value Ref Range   WBC, Ur, HPF, POC 5+    RBC, urine, microscopic neg    Bacteria, U Microscopic occas    Mucus, UA     Epithelial cells, urine per micros multiple    Crystals, Ur, HPF, POC     Casts, Ur, LPF, POC     Yeast, UA     Poor sample with multiple epis.  Assessment: Urinary tract infection without hematuria, site unspecified - Plan: POCT urinalysis dipstick  Screening - Plan: Basic metabolic panel, CBC with Differential/Platelet, Hepatic function panel, Lipid panel, Vit D  25 hydroxy (rtn osteoporosis monitoring), CANCELED: CBC with Differential/Platelet, CANCELED: Basic metabolic panel, CANCELED: Hepatic function panel, CANCELED: Lipid panel, CANCELED: Vit D  25 hydroxy (rtn osteoporosis monitoring)  Plan:  Meds ordered this encounter  Medications  . nitrofurantoin,  macrocrystal-monohydrate, (MACROBID) 100 MG capsule    Sig: take 1 tablet by mouth AFTER SEX    Refill:  0  . sulfamethoxazole-trimethoprim (BACTRIM DS,SEPTRA DS) 800-160 MG per tablet    Sig: Take 1 tablet by mouth 2 (two) times daily.    Dispense:  14 tablet    Refill:  0    Order Specific Question:  Supervising Provider    Answer:  Mikey Kirschner [[id number]]  . fluconazole (DIFLUCAN) 150 MG tablet    Sig: One po qd prn yeast infection; may repeat in 3-4 days if needed    Dispense:  2 tablet    Refill:  0    Order Specific Question:  Supervising Provider    Answer:  Mikey Kirschner [[id number]]   AZO x 48 hours then DC. Warning signs reviewed. Call back in 48 hours if no improvement sooner if worse. Routine labs ordered per patient request.

## 2014-11-17 ENCOUNTER — Emergency Department (HOSPITAL_COMMUNITY): Payer: Managed Care, Other (non HMO)

## 2014-11-17 ENCOUNTER — Telehealth: Payer: Self-pay | Admitting: *Deleted

## 2014-11-17 ENCOUNTER — Emergency Department (HOSPITAL_COMMUNITY)
Admission: EM | Admit: 2014-11-17 | Discharge: 2014-11-17 | Disposition: A | Discharge status: Home / Self Care | Payer: Managed Care, Other (non HMO) | Attending: Emergency Medicine | Admitting: Emergency Medicine

## 2014-11-17 ENCOUNTER — Encounter (HOSPITAL_COMMUNITY): Payer: Self-pay | Admitting: Emergency Medicine

## 2014-11-17 DIAGNOSIS — Z880 Allergy status to penicillin: Secondary | ICD-10-CM | POA: Insufficient documentation

## 2014-11-17 DIAGNOSIS — Z87891 Personal history of nicotine dependence: Secondary | ICD-10-CM | POA: Insufficient documentation

## 2014-11-17 DIAGNOSIS — Z8719 Personal history of other diseases of the digestive system: Secondary | ICD-10-CM | POA: Insufficient documentation

## 2014-11-17 DIAGNOSIS — Z79899 Other long term (current) drug therapy: Secondary | ICD-10-CM | POA: Insufficient documentation

## 2014-11-17 DIAGNOSIS — R002 Palpitations: Secondary | ICD-10-CM | POA: Diagnosis not present

## 2014-11-17 LAB — BASIC METABOLIC PANEL
ANION GAP: 6 (ref 5–15)
BUN: 12 mg/dL (ref 6–20)
CO2: 25 mmol/L (ref 22–32)
Calcium: 9.1 mg/dL (ref 8.9–10.3)
Chloride: 107 mmol/L (ref 101–111)
Creatinine, Ser: 0.93 mg/dL (ref 0.44–1.00)
GFR calc Af Amer: >60 mL/min (ref >60)
GFR calc non Af Amer: >60 mL/min (ref >60)
GLUCOSE: 94 mg/dL (ref 65–99)
POTASSIUM: 4 mmol/L (ref 3.5–5.1)
Sodium: 138 mmol/L (ref 135–145)

## 2014-11-17 LAB — D-DIMER, QUANTITATIVE: D-Dimer, Quant: <0.27 ug/mL-FEU (ref 0.00–0.48)

## 2014-11-17 LAB — HEPATIC FUNCTION PANEL
ALK PHOS: 44 U/L (ref 38–126)
ALT: 13 U/L — AB (ref 14–54)
AST: 20 U/L (ref 15–41)
Albumin: 3.6 g/dL (ref 3.5–5.0)
BILIRUBIN DIRECT: 0.2 mg/dL (ref 0.1–0.5)
BILIRUBIN TOTAL: 0.6 mg/dL (ref 0.3–1.2)
Indirect Bilirubin: 0.4 mg/dL (ref 0.3–0.9)
Total Protein: 6.5 g/dL (ref 6.5–8.1)

## 2014-11-17 LAB — TROPONIN I: Troponin I: <0.03 ng/mL (ref <0.031)

## 2014-11-17 LAB — CBC
HEMATOCRIT: 37.1 % (ref 36.0–46.0)
HEMOGLOBIN: 12.2 g/dL (ref 12.0–15.0)
MCH: 29.7 pg (ref 26.0–34.0)
MCHC: 32.9 g/dL (ref 30.0–36.0)
MCV: 90.3 fL (ref 78.0–100.0)
Platelets: 190 10*3/uL (ref 150–400)
RBC: 4.11 MIL/uL (ref 3.87–5.11)
RDW: 12.1 % (ref 11.5–15.5)
WBC: 5.1 10*3/uL (ref 4.0–10.5)

## 2014-11-17 MED ORDER — LORAZEPAM 2 MG/ML IJ SOLN
0.5000 mg | Freq: Once | INTRAMUSCULAR | Status: DC
  Filled 2014-11-17: qty 1

## 2014-11-17 NOTE — ED Notes (Signed)
Pt reports palpitations and neck pain since last night.pt denies any nausea,sob.

## 2014-11-17 NOTE — Discharge Instructions (Signed)
Follow up with your md in a week.  Return if getting worse

## 2014-11-17 NOTE — Telephone Encounter (Signed)
Pt walked in office at 3:45 and states she has been having sharp stabbing chest pain since last night. Left side neck pain, rapid heart rate. Consult with dr Nicki Reaper. Advised pt to go directly to ed. Pt states she will go directly to [facility] ed.

## 2014-11-17 NOTE — ED Provider Notes (Signed)
CSN: [id number]     Arrival date & time 11/17/14  1559 History   First MD Initiated Contact with Patient 11/17/14 1612     Chief Complaint  Patient presents with  . Chest Pain     (Consider location/radiation/quality/duration/timing/severity/associated sxs/prior Treatment) Patient is a 49 y.o. female presenting with palpitations. The history is provided by the patient (Patient complains of palpitations also complains of some pain in her neck.).  Palpitations Palpitations quality:  Fast Onset quality:  Sudden Timing:  Intermittent Progression:  Waxing and waning Chronicity:  New Context: anxiety   Associated symptoms: no back pain, no chest pain and no cough     Past Medical History  Diagnosis Date  . Pain     back  . Contraceptive management 11/13/2012  . Rectal fissure 03/30/2014   Past Surgical History  Procedure Laterality Date  . Cesarean section     Family History  Problem Relation Age of Onset  . Diabetes Mother   . Hypertension Mother   . Diabetes Father   . Heart disease Father   . Stroke Father   . Kidney failure Father   . Cirrhosis Father   . Hypertension Brother   . Cancer Maternal Aunt     colon,liver  . Cancer Maternal Uncle     lung  . Heart disease Paternal Grandfather   . Alzheimer's disease Paternal Grandmother    Social History  Substance Use Topics  . Smoking status: Former Smoker    Types: Cigarettes  . Smokeless tobacco: Never Used  . Alcohol Use: Yes     Comment: occ   OB History    Gravida Para Term Preterm AB TAB SAB Ectopic Multiple Living   1 1        1      Review of Systems  Constitutional: Negative for appetite change and fatigue.  HENT: Negative for congestion, ear discharge and sinus pressure.   Eyes: Negative for discharge.  Respiratory: Negative for cough.   Cardiovascular: Positive for palpitations. Negative for chest pain.  Gastrointestinal: Negative for abdominal pain and diarrhea.  Genitourinary: Negative for  frequency and hematuria.  Musculoskeletal: Negative for back pain.  Skin: Negative for rash.  Neurological: Negative for seizures and headaches.  Psychiatric/Behavioral: Negative for hallucinations.      Allergies  Codeine; Keflex; Penicillins; and Levaquin  Home Medications   Prior to Admission medications   Medication Sig Start Date End Date Taking? Authorizing Provider  ALPRAZolam Duanne Moron) 0.5 MG tablet TAKE 1/2 TO 1 TABLET BY MOUTH TWICE A DAY IF NEEDED 03/20/14  Yes Nilda Simmer, NP  B Complex-Biotin-FA (B-COMPLEX PO) Take 1 tablet by mouth daily.   Yes Historical Provider, MD  CALCIUM-MAGNESIUM-ZINC PO Take 1 tablet by mouth daily.   Yes Historical Provider, MD  naproxen sodium (ANAPROX) 220 MG tablet Take 220 mg by mouth daily as needed (for pain).    Yes Historical Provider, MD  nitrofurantoin, macrocrystal-monohydrate, (MACROBID) 100 MG capsule take 1 tablet by mouth AFTER SEX. 03/30/14  Yes Historical Provider, MD  norelgestromin-ethinyl estradiol Marilu Favre) 150-35 MCG/24HR transdermal patch apply 1 patch and replace weekly for 3 weeks 03/30/14  Yes Estill Dooms, NP  Probiotic Product (PROBIOTIC PO) Take 1 tablet by mouth daily.   Yes Historical Provider, MD  gabapentin (NEURONTIN) 100 MG capsule take 1 capsule three times a day    Kathyrn Drown, MD  sulfamethoxazole-trimethoprim (BACTRIM DS,SEPTRA DS) 800-160 MG per tablet Take 1 tablet by mouth 2 (two) times  daily. Patient not taking: Reported on 11/17/2014 06/24/14   Nilda Simmer, NP  valACYclovir (VALTREX) 1000 MG tablet TAKE 2 TABLETS BY MOUTH NOW AND 2 TABLETS EVERY MORNING FOR COLD SORE 08/21/12   Estill Dooms, NP   BP 123/66 mmHg  Pulse 63  Temp(Src) 98.3 F (36.8 C) (Oral)  Resp 14  Ht 5\' 1"  (1.549 m)  Wt 125 lb (56.7 kg)  BMI 23.63 kg/m2  SpO2 98%  LMP 11/17/2014 Physical Exam  Constitutional: She is oriented to person, place, and time. She appears well-developed.  HENT:  Head: Normocephalic.   Eyes: Conjunctivae and EOM are normal. No scleral icterus.  Neck: Neck supple. No thyromegaly present.  Cardiovascular: Normal rate and regular rhythm.  Exam reveals no gallop and no friction rub.   No murmur heard. Patient's heart rate was 70 and regular when I examined her. She was complaining of her heart racing at that time  Pulmonary/Chest: No stridor. She has no wheezes. She has no rales. She exhibits no tenderness.  Abdominal: She exhibits no distension. There is no tenderness. There is no rebound.  Musculoskeletal: Normal range of motion. She exhibits no edema.  Lymphadenopathy:    She has no cervical adenopathy.  Neurological: She is oriented to person, place, and time. She exhibits normal muscle tone. Coordination normal.  Skin: No rash noted. No erythema.  Psychiatric: She has a normal mood and affect. Her behavior is normal.    ED Course  Procedures (including critical care time) Labs Review Labs Reviewed  HEPATIC FUNCTION PANEL - Abnormal; Notable for the following:    ALT 13 (*)    All other components within normal limits  BASIC METABOLIC PANEL  CBC  TROPONIN I  D-DIMER, QUANTITATIVE (NOT AT [facility])    Imaging Review Dg Chest 2 View  11/17/2014  CLINICAL DATA:  Chest pain for 1 day EXAM: CHEST  2 VIEW COMPARISON:  None. FINDINGS: The lungs are clear. The heart size and pulmonary vascularity are normal. No pneumothorax. No adenopathy. No bone lesions. IMPRESSION: No edema or consolidation. Electronically Signed   By: Lowella Grip III M.D.   On: 11/17/2014 16:54   I have personally reviewed and evaluated these images and lab results as part of my medical decision-making.   EKG Interpretation   Date/Time:  Tuesday November 17 2014 16:12:34 EDT Ventricular Rate:  62 PR Interval:  129 QRS Duration: 75 QT Interval:  399 QTC Calculation: 405 R Axis:   80 Text Interpretation:  Sinus rhythm Anteroseptal infarct, old Confirmed by  Jt Brabec  MD, Millena Callins [phone]) on  11/17/2014 4:25:13 PM Also confirmed by  Alvenia Treese  MD, Broadus John [phone])  on 11/17/2014 6:38:33 PM      MDM   Final diagnoses:  Palpitations    Palpitations symptoms secondary to anxiety. Patient's symptoms improved before discharge is unremarkable she is to follow-up with her PCP    Milton Ferguson, MD 11/17/14 1843

## 2014-11-18 ENCOUNTER — Telehealth: Payer: Self-pay | Admitting: Family Medicine

## 2014-11-18 NOTE — Telephone Encounter (Signed)
Pt states that she was sent across the street for xray or due to chest pain She asked you she says as well as over there to check her neck too because It is giving her constant pain, she thinks its swollen as well. Wants to know if you  Can set her up for a scan for this? She states strokes run in her family an wants To be careful  Advised she may need to be seen

## 2014-11-18 NOTE — Telephone Encounter (Signed)
Give patient appointment for Friday

## 2014-11-18 NOTE — Telephone Encounter (Signed)
[facility] 11/18/14

## 2014-11-18 NOTE — Telephone Encounter (Signed)
[facility] 11/18/14

## 2014-11-18 NOTE — Telephone Encounter (Signed)
Spoke with patient to get clarity on the previous message. Patient stated that she did go to the ER for the chest and neck pain as directed to do yesterday. Patient states that they addressed chest pain but did not address neck pain and swelling. Patient would like to know if you can have an order for a Neck ultrasound for her arteries due to the fact that it is swollen and she has family history of strokes. Patient states she is willing to be seen for the issue if you would like to see her first but she is only available to come in this Friday.

## 2014-11-19 NOTE — Telephone Encounter (Signed)
[facility]

## 2014-11-19 NOTE — Telephone Encounter (Signed)
Discussed with pt. Transferred to front to schedule office visit for tomorrow

## 2014-11-20 ENCOUNTER — Ambulatory Visit (INDEPENDENT_AMBULATORY_CARE_PROVIDER_SITE_OTHER): Payer: Managed Care, Other (non HMO) | Admitting: Family Medicine

## 2014-11-20 VITALS — BP 122/80 | Ht 61.0 in | Wt 127.0 lb

## 2014-11-20 DIAGNOSIS — R002 Palpitations: Secondary | ICD-10-CM

## 2014-11-20 DIAGNOSIS — R5383 Other fatigue: Secondary | ICD-10-CM | POA: Diagnosis not present

## 2014-11-20 DIAGNOSIS — M542 Cervicalgia: Secondary | ICD-10-CM

## 2014-11-20 MED ORDER — SULFAMETHOXAZOLE-TRIMETHOPRIM 800-160 MG PO TABS: 1.0000 | ORAL_TABLET | Freq: Two times a day (BID) | ORAL | Status: DC

## 2014-11-20 NOTE — Progress Notes (Signed)
   Subjective:    Patient ID: [name] [name], female    DOB: December 02, 1965, 49 y.o.   MRN: [id number]  HPI Patient arrives with c/o left sided neck pain and ear pain since Tuesday.  patient went to the ER was told everything seemed to be all right She relates a lot of times where she feels like her heart is racing where she is feels she is having palpitations she describes sharp pains in her chest go up the left side of her neck she states she has a strong family history of heart disease and strokes and she is worried about that. 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 She denies shortness of breath but does not do any vigorous exercise. PMH benign  Review of Systems  she relates palpitations denies wheezing difficulty breathing nausea vomiting diarrhea    Objective:   Physical Exam  neck no masses are felt thyroid feels normal lungs are clear hearts regular pulse normal extremities no edema       Assessment & Plan:   palpitations- I find no evidence of any serious heart disease currently she may well benefit from a repeat echo patient's dad sees Dr. Harl Bowie we will refer her to Dr. Harl Bowie for further evaluation    thyroid testing ordered because of palpitations  lipid screening ordered

## 2014-11-22 LAB — LIPID PANEL
CHOL/HDL RATIO: 2 ratio (ref 0.0–4.4)
Cholesterol, Total: 158 mg/dL (ref 100–199)
HDL: 79 mg/dL (ref >39)
LDL CALC: 67 mg/dL (ref 0–99)
TRIGLYCERIDES: 60 mg/dL (ref 0–149)
VLDL Cholesterol Cal: 12 mg/dL (ref 5–40)

## 2014-11-22 LAB — TSH: TSH: 0.744 u[IU]/mL (ref 0.450–4.500)

## 2014-11-23 ENCOUNTER — Encounter: Payer: Self-pay | Admitting: Family Medicine

## 2014-12-16 ENCOUNTER — Ambulatory Visit: Payer: Managed Care, Other (non HMO) | Admitting: Cardiology

## 2015-01-13 ENCOUNTER — Ambulatory Visit: Payer: Managed Care, Other (non HMO) | Admitting: Cardiology

## 2015-02-17 ENCOUNTER — Telehealth: Payer: Self-pay | Admitting: Adult Health

## 2015-02-17 MED ORDER — NITROFURANTOIN MONOHYD MACRO 100 MG PO CAPS: ORAL_CAPSULE | ORAL | Status: DC

## 2015-02-17 NOTE — Telephone Encounter (Signed)
Spoke with pt. Pt is needing a refill on Macrobid. She takes it after intercourse. Please advise. Thanks!! [place]

## 2015-02-17 NOTE — Telephone Encounter (Signed)
Will refill macrobid

## 2015-03-05 ENCOUNTER — Encounter: Payer: Self-pay | Admitting: Family Medicine

## 2015-03-05 ENCOUNTER — Ambulatory Visit (INDEPENDENT_AMBULATORY_CARE_PROVIDER_SITE_OTHER): Payer: Managed Care, Other (non HMO) | Admitting: Family Medicine

## 2015-03-05 VITALS — Temp 98.0°F | Ht 61.0 in | Wt 120.6 lb

## 2015-03-05 DIAGNOSIS — M25562 Pain in left knee: Secondary | ICD-10-CM

## 2015-03-05 DIAGNOSIS — N3000 Acute cystitis without hematuria: Secondary | ICD-10-CM

## 2015-03-05 DIAGNOSIS — R309 Painful micturition, unspecified: Secondary | ICD-10-CM | POA: Diagnosis not present

## 2015-03-05 LAB — POCT URINALYSIS DIPSTICK
Blood, UA: NEGATIVE
Spec Grav, UA: <=1.005
pH, UA: 7

## 2015-03-05 MED ORDER — SULFAMETHOXAZOLE-TRIMETHOPRIM 800-160 MG PO TABS: 1.0000 | ORAL_TABLET | Freq: Two times a day (BID) | ORAL | Status: DC

## 2015-03-05 NOTE — Progress Notes (Signed)
   Subjective:    Patient ID: [name] [name], female    DOB: September 15, 1965, 50 y.o.   MRN: [id number]  Urinary Tract Infection  This is a new problem. The current episode started in the past 7 days. Associated symptoms comments: Painful urination .   Results for orders placed or performed in visit on 03/05/15  POCT urinalysis dipstick  Result Value Ref Range   Color, UA Yellow    Clarity, UA Cloudy    Glucose, UA     Bilirubin, UA     Ketones, UA     Spec Grav, UA <=1.005    Blood, UA Negative    pH, UA 7.0    Protein, UA     Urobilinogen, UA     Nitrite, UA     Leukocytes, UA large (3+) (A) Negative   Denies high fever chills sweats nausea vomiting diarrhea does relate some intermittent left knee pain which is been going on for quite some time   Review of Systems Dysuria urinary frequency    Objective:   Physical Exam Lungs clear heart regular abdomen is soft Flanks nontender Urinalysis with wbc's  We will culture urine    Assessment & Plan:  Postcoital UTI-antibiotics prescribed warning signs discussed follow-up if problems Continue Macrobid for now after relations. We will look in her paper chart to see if she is taken Cipro before without trouble she thinks she has Warning signs such as fever flank pain discussed  She also has left knee pain and discomfort she states she will be setting herself up with orthopedics but she will follow-up with [place] if this area worsens if she does need a referral she is simply to call here and we will help set her up

## 2015-03-07 LAB — URINE CULTURE

## 2015-03-09 ENCOUNTER — Telehealth: Payer: Self-pay | Admitting: Family Medicine

## 2015-03-09 MED ORDER — FLUCONAZOLE 150 MG PO TABS: ORAL_TABLET | ORAL | Status: DC

## 2015-03-09 NOTE — Telephone Encounter (Signed)
[facility] to get symptoms

## 2015-03-09 NOTE — Telephone Encounter (Signed)
Pt called requesting diflucan to be called in, pt was recently on an antibiotic.     [facility]

## 2015-03-09 NOTE — Telephone Encounter (Signed)
Spoke with patient to discuss symptoms. Patient has c/o of vaginal itching, and burning with white discharge. Diflucan 150 mg 1 tablet 3 days apart was sent in per facility protocol for yeast infection. Patient verbalized understanding.

## 2015-04-02 ENCOUNTER — Ambulatory Visit (INDEPENDENT_AMBULATORY_CARE_PROVIDER_SITE_OTHER): Payer: Managed Care, Other (non HMO) | Admitting: Adult Health

## 2015-04-02 ENCOUNTER — Encounter: Payer: Self-pay | Admitting: Adult Health

## 2015-04-02 ENCOUNTER — Other Ambulatory Visit: Payer: Self-pay | Admitting: Adult Health

## 2015-04-02 ENCOUNTER — Other Ambulatory Visit (HOSPITAL_COMMUNITY)
Admission: RE | Admit: 2015-04-02 | Discharge: 2015-04-02 | Disposition: A | Discharge status: Home / Self Care | Payer: Managed Care, Other (non HMO) | Source: Ambulatory Visit | Attending: Adult Health | Admitting: Adult Health

## 2015-04-02 VITALS — BP 120/72 | HR 68 | Ht 60.0 in | Wt 119.5 lb

## 2015-04-02 DIAGNOSIS — Z1212 Encounter for screening for malignant neoplasm of rectum: Secondary | ICD-10-CM | POA: Diagnosis not present

## 2015-04-02 DIAGNOSIS — K649 Unspecified hemorrhoids: Secondary | ICD-10-CM | POA: Insufficient documentation

## 2015-04-02 DIAGNOSIS — Z1151 Encounter for screening for human papillomavirus (HPV): Secondary | ICD-10-CM | POA: Diagnosis not present

## 2015-04-02 DIAGNOSIS — Z1231 Encounter for screening mammogram for malignant neoplasm of breast: Secondary | ICD-10-CM

## 2015-04-02 DIAGNOSIS — Z01419 Encounter for gynecological examination (general) (routine) without abnormal findings: Secondary | ICD-10-CM | POA: Diagnosis not present

## 2015-04-02 DIAGNOSIS — Z3040 Encounter for surveillance of contraceptives, unspecified: Secondary | ICD-10-CM

## 2015-04-02 HISTORY — DX: Unspecified hemorrhoids: K64.9

## 2015-04-02 LAB — HEMOCCULT GUIAC POC 1CARD (OFFICE): Fecal Occult Blood, POC: NEGATIVE

## 2015-04-02 MED ORDER — NORELGESTROMIN-ETH ESTRADIOL 150-35 MCG/24HR TD PTWK: MEDICATED_PATCH | TRANSDERMAL | Status: DC

## 2015-04-02 MED ORDER — DIAZEPAM 2 MG PO TABS: ORAL_TABLET | ORAL | Status: DC

## 2015-04-02 NOTE — Progress Notes (Signed)
Patient ID: [name] [name], female   DOB: 25-Sep-1965, 50 y.o.   MRN: [id number] History of Present Illness: [name] [name] is a 50 year old white female in for well woman gyn exam and pap.She has occasional UTI after sex, has macrobid to take.She has had a couple of episodes of veritgo this year. She is happy with the patch. She has seen blood when wipes, no pain or change in bowels. PCP is [facility].   Current Medications, Allergies, Past Medical History, Past Surgical History, Family History and Social History were reviewed in [employer] record.     Review of Systems: Patient denies any headaches, hearing loss, fatigue, blurred vision, shortness of breath, chest pain, abdominal pain, problems with bowel movements, urination, or intercourse. No joint pain or mood swings.See HPI for positives.    Physical Exam:BP 120/72 mmHg  Pulse 68  Ht 5' (1.524 m)  Wt 119 lb 8 oz (54.205 kg)  BMI 23.34 kg/m2  LMP 03/12/2015 (Approximate) General:  Well developed, well nourished, no acute distress Skin:  Warm and dry Neck:  Midline trachea, normal thyroid, good ROM, no lymphadenopathy Lungs; Clear to auscultation bilaterally Breast:  No dominant palpable mass, retraction, or nipple discharge, has bilateral inverted nipples Cardiovascular: Regular rate and rhythm Abdomen:  Soft, non tender, no hepatosplenomegaly Pelvic:  External genitalia is normal in appearance, no lesions.  The vagina is normal in appearance. Urethra has no lesions or masses. The cervix is smooth, pap with HPV performed.  Uterus is felt to be normal size, shape, and contour.  No adnexal masses or tenderness noted.Bladder is non tender, no masses felt. Rectal: Good sphincter tone, no polyps, internal  hemorrhoids felt.  Hemoccult negative. Extremities/musculoskeletal:  No swelling or varicosities noted, no clubbing or cyanosis, has plantar warts on feet Psych:  No mood changes, alert and cooperative,seems  happy   Impression: Well woman gyn exam and pap Contraceptive management Hemorrhoids     Plan: Refilled Xulane for 1 year Rx valium 2 mg #30 take 1 tid prn vertigo, no refills Physical in 50 year, pap in 3 if normal Mammogram now and yearly Colonoscopy at 50  Number given for Dr Caprice Beaver

## 2015-04-02 NOTE — Patient Instructions (Signed)
Take valium prn vertigo  Physical in 1 year, pap in 3 if normal Get mammogram  Colonoscopy at 82

## 2015-04-06 LAB — CYTOLOGY - PAP

## 2015-04-07 ENCOUNTER — Ambulatory Visit (HOSPITAL_COMMUNITY): Payer: Managed Care, Other (non HMO)

## 2015-04-10 ENCOUNTER — Other Ambulatory Visit: Payer: Self-pay | Admitting: Adult Health

## 2015-08-10 ENCOUNTER — Telehealth: Payer: Self-pay | Admitting: Family Medicine

## 2015-08-10 DIAGNOSIS — Z139 Encounter for screening, unspecified: Secondary | ICD-10-CM

## 2015-08-10 NOTE — Telephone Encounter (Signed)
Left message return call. 08/10/15 

## 2015-08-10 NOTE — Telephone Encounter (Signed)
Pt called back stating that it does not need to be done ASAP she now has till oct  If we can just order the labs at your convenience then she can go get it done  At her convenience between now and oct

## 2015-08-10 NOTE — Telephone Encounter (Signed)
Lipid,glucose,A1C,forward form to the front to provide with ov

## 2015-08-10 NOTE — Telephone Encounter (Signed)
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

## 2015-08-13 NOTE — Telephone Encounter (Signed)
Labs ordered in [facility] 08/10/15- patient will have labs done before office visit in October.

## 2015-08-16 ENCOUNTER — Ambulatory Visit: Payer: Managed Care, Other (non HMO) | Admitting: Nurse Practitioner

## 2015-08-23 ENCOUNTER — Ambulatory Visit (INDEPENDENT_AMBULATORY_CARE_PROVIDER_SITE_OTHER): Payer: Managed Care, Other (non HMO) | Admitting: Family Medicine

## 2015-08-23 ENCOUNTER — Encounter: Payer: Self-pay | Admitting: Family Medicine

## 2015-08-23 VITALS — BP 110/72 | Temp 97.8°F | Ht 61.0 in | Wt 128.0 lb

## 2015-08-23 DIAGNOSIS — R300 Dysuria: Secondary | ICD-10-CM

## 2015-08-23 DIAGNOSIS — N390 Urinary tract infection, site not specified: Secondary | ICD-10-CM

## 2015-08-23 LAB — POCT URINALYSIS DIPSTICK
Spec Grav, UA: 1.015
pH, UA: 6

## 2015-08-23 MED ORDER — SULFAMETHOXAZOLE-TRIMETHOPRIM 800-160 MG PO TABS: 1.0000 | ORAL_TABLET | Freq: Two times a day (BID) | ORAL | 0 refills | Status: DC

## 2015-08-23 NOTE — Progress Notes (Signed)
   Subjective:    Patient ID: [name] [name], female    DOB: [birth year], 50 y.o.   MRN: [id number]  HPI Patient arrives with c/o dysuria that started today Urinary frequency dysuria denies high fever chills denies flank pain nausea vomiting diarrhea Patient denies high fever chills She does state urinary frequency dysuria She denies 2-3 infections per year She also has family history of cirrhosis which bothers her she doesn't really know any details she will try to find out Review of Systems  Constitutional: Negative for fatigue and fever.  Respiratory: Negative for cough.   Gastrointestinal: Negative for abdominal pain.  Genitourinary: Positive for dysuria and frequency. Negative for flank pain.       Objective:   Physical Exam  Constitutional: She appears well-nourished. No distress.  Cardiovascular: Normal rate, regular rhythm and normal heart sounds.   No murmur heard. Pulmonary/Chest: Effort normal and breath sounds normal. No respiratory distress.  Musculoskeletal: She exhibits no edema.  Lymphadenopathy:    She has no cervical adenopathy.  Neurological: She is alert. She exhibits normal muscle tone.  Psychiatric: Her behavior is normal.  Vitals reviewed.         Assessment & Plan:  UTI-treated with antibiotics Culture recommended Await the results Bactrim DS twice a day Warning signs discussed Offered referral to urology for frequent UTI patient Patient with family history of cirrhosis for liver enzymes in the past and normal she will be getting lab work through her workplace she requests liver profile

## 2015-08-25 LAB — URINE CULTURE

## 2015-08-25 LAB — PLEASE NOTE

## 2015-08-31 ENCOUNTER — Encounter: Payer: Self-pay | Admitting: Cardiology

## 2015-09-27 ENCOUNTER — Ambulatory Visit: Payer: Managed Care, Other (non HMO) | Admitting: Cardiology

## 2015-10-01 ENCOUNTER — Encounter: Payer: Self-pay | Admitting: Cardiology

## 2015-10-01 ENCOUNTER — Ambulatory Visit (INDEPENDENT_AMBULATORY_CARE_PROVIDER_SITE_OTHER): Payer: Managed Care, Other (non HMO) | Admitting: Cardiology

## 2015-10-01 VITALS — BP 118/80 | HR 67 | Ht 61.0 in | Wt 123.0 lb

## 2015-10-01 DIAGNOSIS — R002 Palpitations: Secondary | ICD-10-CM

## 2015-10-01 DIAGNOSIS — K219 Gastro-esophageal reflux disease without esophagitis: Secondary | ICD-10-CM | POA: Diagnosis not present

## 2015-10-01 NOTE — Patient Instructions (Addendum)
Your physician recommends that you schedule a follow-up appointment in: 1 month   Your physician has recommended that you wear an event monitor. Event monitors are medical devices that record the heart's electrical activity. Doctors most often [place] these monitors to diagnose arrhythmias. Arrhythmias are problems with the speed or rhythm of the heartbeat. The monitor is a small, portable device. You can wear one while you do your normal daily activities. This is usually used to diagnose what is causing palpitations/syncope (passing out).  Take over the counter Zantac 150 mg twice a day   Your physician recommends that you continue on your current medications as directed. Please refer to the Current Medication list given to you today.   Thank you for choosing [facility] !

## 2015-10-01 NOTE — Progress Notes (Signed)
Clinical Summary [name] [name] is a 50 y.o.female seen as a new patient, she is referred by Dr [name] [name]  1. Palpitations - started about 2-3 years ago - feeling of heart skipping and racing, feels SOB. Lasts few minutes. Occurs 2 times a week. Can occur at rest or with activity. - 1 cup coffee in AM, no sodas, no energy drinks, no tea, occasional EtoH.  - walks regularly and hikes without issues.   2. GERD - occurs once a week. Worst with greasy or spicy foods. Has not tried an antacid.     SH: upcoming trip to [place], [place].   Past Medical History:  Diagnosis Date  . Contraceptive management 11/13/2012  . Frequent UTI    after sex  . Hemorrhoids 04/02/2015  . Pain    back  . Rectal fissure 03/30/2014  . Vertigo      Allergies  Allergen Reactions  . Codeine Nausea And Vomiting  . Keflex [Cephalexin] Swelling  . Penicillins Nausea And Vomiting    Has patient had a PCN reaction causing immediate rash, facial/tongue/throat swelling, SOB or lightheadedness with hypotension: No Has patient had a PCN reaction causing severe rash involving mucus membranes or skin necrosis: No Has patient had a PCN reaction that required hospitalization No Has patient had a PCN reaction occurring within the last 10 years: No If all of the above answers are "NO", then may proceed with Cephalosporin use.   [name] [name] [Levofloxacin In D5w] Swelling and Rash    Tongue, throat swelling, oral rash-patient has taken Cipro and tolerated this medication. Although the cause it is a similar medicine we try to avoid Cipro     Current Outpatient Prescriptions  Medication Sig Dispense Refill  . ALPRAZolam (XANAX) 0.5 MG tablet TAKE 1/2 TO 1 TABLET BY MOUTH TWICE A DAY IF NEEDED 28 tablet 2  . diazepam (VALIUM) 2 MG tablet Take 1 every 8 hours as needed for vertigo 30 tablet 0  . gabapentin (NEURONTIN) 100 MG capsule take 1 capsule three times a day 90 capsule 1  . naproxen sodium (ANAPROX) 220 MG  tablet Take 220 mg by mouth daily as needed (for pain).     . nitrofurantoin, macrocrystal-monohydrate, (MACROBID) 100 MG capsule Take 100 mg by mouth 2 (two) times daily.    Marland Kitchen sulfamethoxazole-trimethoprim (BACTRIM DS,SEPTRA DS) 800-160 MG tablet Take 1 tablet by mouth 2 (two) times daily. 14 tablet 0  . UNABLE TO FIND Osteo Biflex-1 daily    . XULANE 150-35 MCG/24HR transdermal patch apply 1 patch and replace weekly for 3 weeks 3 patch 5   No current facility-administered medications for this visit.      Past Surgical History:  Procedure Laterality Date  . CESAREAN SECTION       Allergies  Allergen Reactions  . Codeine Nausea And Vomiting  . Keflex [Cephalexin] Swelling  . Penicillins Nausea And Vomiting    Has patient had a PCN reaction causing immediate rash, facial/tongue/throat swelling, SOB or lightheadedness with hypotension: No Has patient had a PCN reaction causing severe rash involving mucus membranes or skin necrosis: No Has patient had a PCN reaction that required hospitalization No Has patient had a PCN reaction occurring within the last 10 years: No If all of the above answers are "NO", then may proceed with Cephalosporin use.   [name] [name] [Levofloxacin In D5w] Swelling and Rash    Tongue, throat swelling, oral rash-patient has taken Cipro and tolerated this medication. Although the  cause it is a similar medicine we try to avoid Cipro      Family History  Problem Relation Age of Onset  . Diabetes Mother   . Hypertension Mother   . Diabetes Father   . Heart disease Father   . Stroke Father   . Kidney failure Father   . Cirrhosis Father   . Hypertension Brother   . Cancer Maternal Aunt     colon,liver  . Cancer Maternal Uncle     lung  . Heart disease Paternal Grandfather   . Alzheimer's disease Paternal Grandmother      Social History [name] [name] reports that she has quit smoking. Her smoking use included Cigarettes. She has never used smokeless  tobacco. [name] [name] reports that she drinks alcohol.   Review of Systems CONSTITUTIONAL: No weight loss, fever, chills, weakness or fatigue.  HEENT: Eyes: No visual loss, blurred vision, double vision or yellow sclerae.No hearing loss, sneezing, congestion, runny nose or sore throat.  SKIN: No rash or itching.  CARDIOVASCULAR: per HPI RESPIRATORY: No shortness of breath, cough or sputum.  GASTROINTESTINAL: No anorexia, nausea, vomiting or diarrhea. No abdominal pain or blood.  GENITOURINARY: No burning on urination, no polyuria NEUROLOGICAL: No headache, dizziness, syncope, paralysis, ataxia, numbness or tingling in the extremities. No change in bowel or bladder control.  MUSCULOSKELETAL: No muscle, back pain, joint pain or stiffness.  LYMPHATICS: No enlarged nodes. No history of splenectomy.  PSYCHIATRIC: No history of depression or anxiety.  ENDOCRINOLOGIC: No reports of sweating, cold or heat intolerance. No polyuria or polydipsia.  Marland Kitchen   Physical Examination Vitals:   10/01/15 0849 10/01/15 0853  BP: 121/78 118/80  Pulse: 67    Vitals:   10/01/15 0849  Weight: 123 lb (55.8 kg)  Height: 5\' 1"  (1.549 m)    Gen: resting comfortably, no acute distress HEENT: no scleral icterus, pupils equal round and reactive, no palptable cervical adenopathy,  CV: RRR, no m/r/g, no jvd Resp: Clear to auscultation bilaterally GI: abdomen is soft, non-tender, non-distended, normal bowel sounds, no hepatosplenomegaly MSK: extremities are warm, no edema.  Skin: warm, no rash Neuro:  no focal deficits Psych: appropriate affect    Assessment and Plan  1. Palpitations - we will obtain 14 day monitior - EKG in clinic shows NSR  2. GERD - recommend trial of zantac OTC 150mg  bid  F/u 1 month     Arnoldo Lenis, M.D., F.A.C.C.

## 2015-10-11 ENCOUNTER — Encounter (INDEPENDENT_AMBULATORY_CARE_PROVIDER_SITE_OTHER): Payer: Managed Care, Other (non HMO)

## 2015-10-11 DIAGNOSIS — R002 Palpitations: Secondary | ICD-10-CM | POA: Diagnosis not present

## 2015-10-14 ENCOUNTER — Telehealth: Payer: Self-pay | Admitting: Adult Health

## 2015-10-14 DIAGNOSIS — Z131 Encounter for screening for diabetes mellitus: Secondary | ICD-10-CM

## 2015-10-14 DIAGNOSIS — Z01419 Encounter for gynecological examination (general) (routine) without abnormal findings: Secondary | ICD-10-CM

## 2015-10-14 DIAGNOSIS — Z1329 Encounter for screening for other suspected endocrine disorder: Secondary | ICD-10-CM

## 2015-10-14 DIAGNOSIS — Z13220 Encounter for screening for lipoid disorders: Secondary | ICD-10-CM

## 2015-10-14 NOTE — Telephone Encounter (Signed)
Pt called stating that she would like lab orders place for her at [facility]. Please contact pt

## 2015-10-14 NOTE — Telephone Encounter (Signed)
Patient called requesting orders for lab work to be done at [facility] since they were not done with her visit in March for her physical. Orders were placed. Patient came by and picked up orders.

## 2015-10-15 ENCOUNTER — Other Ambulatory Visit: Payer: Self-pay | Admitting: Adult Health

## 2015-10-15 LAB — COMPREHENSIVE METABOLIC PANEL
ALT: 12 U/L (ref 6–29)
AST: 13 U/L (ref 10–35)
Albumin: 4 g/dL (ref 3.6–5.1)
Alkaline Phosphatase: 42 U/L (ref 33–115)
BUN: 11 mg/dL (ref 7–25)
CALCIUM: 8.9 mg/dL (ref 8.6–10.2)
CHLORIDE: 105 mmol/L (ref 98–110)
CO2: 27 mmol/L (ref 20–31)
CREATININE: 0.75 mg/dL (ref 0.50–1.10)
Glucose, Bld: 88 mg/dL (ref 65–99)
POTASSIUM: 4.8 mmol/L (ref 3.5–5.3)
SODIUM: 137 mmol/L (ref 135–146)
Total Bilirubin: 0.5 mg/dL (ref 0.2–1.2)
Total Protein: 6.9 g/dL (ref 6.1–8.1)

## 2015-10-15 LAB — CBC
HCT: 39.4 % (ref 35.0–45.0)
HEMOGLOBIN: 13.1 g/dL (ref 11.7–15.5)
MCH: 29.8 pg (ref 27.0–33.0)
MCHC: 33.2 g/dL (ref 32.0–36.0)
MCV: 89.7 fL (ref 80.0–100.0)
MPV: 10.9 fL (ref 7.5–12.5)
PLATELETS: 250 10*3/uL (ref 140–400)
RBC: 4.39 MIL/uL (ref 3.80–5.10)
RDW: 13.4 % (ref 11.0–15.0)
WBC: 6.2 10*3/uL (ref 3.8–10.8)

## 2015-10-15 LAB — LIPID PANEL
CHOLESTEROL: 153 mg/dL (ref 125–200)
HDL: 68 mg/dL (ref >=46)
LDL Cholesterol: 70 mg/dL (ref <130)
TRIGLYCERIDES: 73 mg/dL (ref <150)
Total CHOL/HDL Ratio: 2.3 Ratio (ref <=5.0)
VLDL: 15 mg/dL (ref <30)

## 2015-10-15 LAB — GLUCOSE, RANDOM: Glucose, Bld: 88 mg/dL (ref 65–99)

## 2015-10-15 LAB — TSH: TSH: 0.58 mIU/L

## 2015-10-16 LAB — HEMOGLOBIN A1C
HEMOGLOBIN A1C: 5.5 % (ref <5.7)
Mean Plasma Glucose: 111 mg/dL

## 2015-10-22 ENCOUNTER — Telehealth: Payer: Self-pay | Admitting: Adult Health

## 2015-10-22 NOTE — Telephone Encounter (Signed)
Left message labs looked good 

## 2015-11-01 ENCOUNTER — Ambulatory Visit: Payer: Managed Care, Other (non HMO) | Admitting: Adult Health

## 2015-11-01 NOTE — Progress Notes (Deleted)
Cardiology Office Note   Date:  11/01/2015   ID:  [name] [name], DOB 06-20-1965, MRN [id number]  PCP:  Kathyrn Drown (Inactive)  Cardiologist: Cloria Spring, NP   No chief complaint on file.     History of Present Illness: [name] [name] is a 50 y.o. female who presents for ongoing assessment and management of palpitations. She was last seen by Dr. Harl Bowie on 10/01/2015. At that time a 14 day monitor was scheduled. This has not been read.    Past Medical History:  Diagnosis Date  . Contraceptive management 11/13/2012  . Frequent UTI    after sex  . Hemorrhoids 04/02/2015  . Pain    back  . Rectal fissure 03/30/2014  . Vertigo     Past Surgical History:  Procedure Laterality Date  . CESAREAN SECTION       Current Outpatient Prescriptions  Medication Sig Dispense Refill  . ALPRAZolam (XANAX) 0.5 MG tablet TAKE 1/2 TO 1 TABLET BY MOUTH TWICE A DAY IF NEEDED 28 tablet 2  . diazepam (VALIUM) 2 MG tablet Take 1 every 8 hours as needed for vertigo 30 tablet 0  . gabapentin (NEURONTIN) 100 MG capsule take 1 capsule three times a day 90 capsule 1  . naproxen sodium (ANAPROX) 220 MG tablet Take 220 mg by mouth daily as needed (for pain).     . nitrofurantoin, macrocrystal-monohydrate, (MACROBID) 100 MG capsule Take 100 mg by mouth 2 (two) times daily.    Marland Kitchen sulfamethoxazole-trimethoprim (BACTRIM DS,SEPTRA DS) 800-160 MG tablet Take 1 tablet by mouth 2 (two) times daily. 14 tablet 0  . UNABLE TO FIND Osteo Biflex-1 daily    . XULANE 150-35 MCG/24HR transdermal patch apply 1 patch and replace weekly for 3 weeks 3 patch 5   No current facility-administered medications for this visit.     Allergies:   Codeine; Keflex [cephalexin]; Penicillins; and Levaquin [levofloxacin in d5w]    Social History:  The patient  reports that she quit smoking about 21 years ago. Her smoking use included Cigarettes. She started smoking about 28 years ago. She has never used smokeless  tobacco. She reports that she drinks alcohol. She reports that she does not use drugs.   Family History:  The patient's family history includes Alzheimer's disease in her paternal grandmother; Cancer in her maternal aunt and maternal uncle; Cirrhosis in her father; Diabetes in her father and mother; Heart disease in her father and paternal grandfather; Hypertension in her brother and mother; Kidney failure in her father; Stroke in her father.    ROS: All other systems are reviewed and negative. Unless otherwise mentioned in H&P    PHYSICAL EXAM: VS:  There were no vitals taken for this visit. , BMI There is no height or weight on file to calculate BMI. GEN: Well nourished, well developed, in no acute distress HEENT: normal Neck: no JVD, carotid bruits, or masses Cardiac: ***RRR; no murmurs, rubs, or gallops,no edema  Respiratory:  clear to auscultation bilaterally, normal work of breathing GI: soft, nontender, nondistended, + BS MS: no deformity or atrophy Skin: warm and dry, no rash Neuro:  Strength and sensation are intact Psych: euthymic mood, full affect   EKG:  EKG {ACTION; IS/IS [id number] ordered today. The ekg ordered today demonstrates ***   Recent Labs: 10/15/2015: ALT 12; BUN 11; Creat 0.75; Hemoglobin 13.1; Platelets 250; Potassium 4.8; Sodium 137; TSH 0.58    Lipid Panel    Component Value Date/Time   CHOL  153 10/15/2015 1031   CHOL 158 11/21/2014 0831   TRIG 73 10/15/2015 1031   HDL 68 10/15/2015 1031   HDL 79 11/21/2014 0831   CHOLHDL 2.3 10/15/2015 1031   VLDL 15 10/15/2015 1031   LDLCALC 70 10/15/2015 1031   LDLCALC 67 11/21/2014 0831      Wt Readings from Last 3 Encounters:  10/01/15 123 lb (55.8 kg)  08/23/15 128 lb (58.1 kg)  04/02/15 119 lb 8 oz (54.2 kg)      Other studies Reviewed: Additional studies/ records that were reviewed today include: ***. Review of the above records demonstrates: ***   ASSESSMENT AND PLAN:  1.   ***   Current medicines are reviewed at length with the patient today.    Labs/ tests ordered today include: *** No orders of the defined types were placed in this encounter.    Disposition:   FU with *** in {gen number [id number] {TIME; UNITS DAY/WEEK/MONTH:19136}   Signed, Jory Sims, NP  11/01/2015 7:19 AM    [facility] [address] [place], [place] [address] Phone: [phone]; Fax: [phone]

## 2015-11-04 ENCOUNTER — Telehealth: Payer: Self-pay

## 2015-11-04 NOTE — Telephone Encounter (Signed)
Mailed pt letter after several attempts to reach her with monitor results.

## 2015-11-12 ENCOUNTER — Ambulatory Visit: Payer: Managed Care, Other (non HMO) | Admitting: Adult Health

## 2015-12-08 ENCOUNTER — Ambulatory Visit (INDEPENDENT_AMBULATORY_CARE_PROVIDER_SITE_OTHER): Payer: Managed Care, Other (non HMO) | Admitting: Nurse Practitioner

## 2015-12-08 ENCOUNTER — Encounter: Payer: Self-pay | Admitting: Nurse Practitioner

## 2015-12-08 VITALS — BP 120/80 | Temp 97.4°F | Wt 126.2 lb

## 2015-12-08 DIAGNOSIS — J329 Chronic sinusitis, unspecified: Secondary | ICD-10-CM

## 2015-12-08 DIAGNOSIS — R300 Dysuria: Secondary | ICD-10-CM

## 2015-12-08 DIAGNOSIS — J310 Chronic rhinitis: Secondary | ICD-10-CM

## 2015-12-08 MED ORDER — SULFAMETHOXAZOLE-TRIMETHOPRIM 800-160 MG PO TABS: 1.0000 | ORAL_TABLET | Freq: Two times a day (BID) | ORAL | 0 refills | Status: DC

## 2015-12-10 ENCOUNTER — Encounter: Payer: Self-pay | Admitting: Nurse Practitioner

## 2015-12-10 LAB — PLEASE NOTE

## 2015-12-10 LAB — URINE CULTURE: Organism ID, Bacteria: NO GROWTH

## 2015-12-10 NOTE — Progress Notes (Signed)
Subjective:  Presents for complaints of possible bladder infection. Symptoms began about a week ago. Has a history of recurrent UTI, takes Macrobid whenever she has intercourse for prevention. Has been taking AZO for the past few days which has helped some. Pressure in the lower abdomen. No fever. No vaginal discharge. Same sexual partner for the past 5 years. Gets regular follow-up with gynecology. Dysuria with urgency and frequency. Bowels normal limit. No nausea vomiting. Some right low back pain. Also complaints of sinus symptoms for the past 2 weeks. Sore throat. Runny nose. Slight cough. No ear pain or wheezing. Now producing green nasal drainage.  Objective:   BP 120/80   Temp 97.4 F (36.3 C)   Wt 126 lb 3.2 oz (57.2 kg)   BMI 23.85 kg/m  NAD. Alert, oriented. TMs clear effusion, no erythema. Pharynx erythematous with green PND noted. Neck supple with mild soft anterior adenopathy. Lungs clear. Heart regular rate rhythm. No CVA tenderness. Abdomen soft nondistended nontender. Urine microscopic negative. Unable to perform UA due to AZO use.  Assessment: Dysuria - Plan: Urine Culture  Rhinosinusitis   Plan:  Meds ordered this encounter  Medications  . sulfamethoxazole-trimethoprim (BACTRIM DS,SEPTRA DS) 800-160 MG tablet    Sig: Take 1 tablet by mouth 2 (two) times daily.    Dispense:  14 tablet    Refill:  0    Order Specific Question:   Supervising Provider    Answer:   Mikey Kirschner [[id number]]   Urine culture pending. Warning signs reviewed. May use AZO for 48 hours then discontinue. Call back next week if no improvement, sooner if worse. Consider referral to urology.

## 2016-01-14 ENCOUNTER — Encounter: Payer: Self-pay | Admitting: Family Medicine

## 2016-01-14 ENCOUNTER — Ambulatory Visit (INDEPENDENT_AMBULATORY_CARE_PROVIDER_SITE_OTHER): Payer: Managed Care, Other (non HMO) | Admitting: Family Medicine

## 2016-01-14 VITALS — BP 118/70 | Temp 98.3°F | Ht 61.0 in | Wt 127.1 lb

## 2016-01-14 DIAGNOSIS — J019 Acute sinusitis, unspecified: Secondary | ICD-10-CM

## 2016-01-14 DIAGNOSIS — B9689 Other specified bacterial agents as the cause of diseases classified elsewhere: Secondary | ICD-10-CM

## 2016-01-14 DIAGNOSIS — B001 Herpesviral vesicular dermatitis: Secondary | ICD-10-CM

## 2016-01-14 MED ORDER — SULFAMETHOXAZOLE-TRIMETHOPRIM 800-160 MG PO TABS: 1.0000 | ORAL_TABLET | Freq: Two times a day (BID) | ORAL | 0 refills | Status: DC

## 2016-01-14 MED ORDER — VALACYCLOVIR HCL 1 G PO TABS: ORAL_TABLET | ORAL | 12 refills | Status: DC

## 2016-01-14 NOTE — Progress Notes (Signed)
   Subjective:    Patient ID: [name] [name], female    DOB: 27-Jul-1965, 50 y.o.   MRN: [id number]  Sinusitis  This is a new problem. The current episode started in the past 7 days. The problem is unchanged. There has been no fever. The pain is moderate. Associated symptoms include congestion, coughing, ear pain, headaches and a sore throat. Treatments tried: Nyquil. The treatment provided no relief.   Patient has no other concerns at this time.    Review of Systems  HENT: Positive for congestion, ear pain and sore throat.   Respiratory: Positive for cough.   Neurological: Positive for headaches.       Objective:   Physical Exam Mild sinus pressure and tenderness eardrums normal throat is normal neck supple lungs clear heart regular patient does not appear toxic   Cold sore on the mouth I recommend Valtrex on a when necessary basis    Assessment & Plan:  Viral syndrome Secondary rhinosinusitis Antibiotic prescribed warning signs discussed

## 2016-03-06 ENCOUNTER — Other Ambulatory Visit: Payer: Self-pay | Admitting: Adult Health

## 2016-03-14 ENCOUNTER — Telehealth: Payer: Self-pay

## 2016-03-14 NOTE — Telephone Encounter (Signed)
Letter mailed

## 2016-03-14 NOTE — Telephone Encounter (Signed)
Recall for tcs °

## 2016-04-14 ENCOUNTER — Other Ambulatory Visit: Payer: Managed Care, Other (non HMO) | Admitting: Adult Health

## 2016-04-23 ENCOUNTER — Emergency Department (HOSPITAL_COMMUNITY)
Admission: EM | Admit: 2016-04-23 | Discharge: 2016-04-23 | Disposition: A | Discharge status: Home / Self Care | Payer: Managed Care, Other (non HMO) | Attending: Emergency Medicine | Admitting: Emergency Medicine

## 2016-04-23 ENCOUNTER — Encounter (HOSPITAL_COMMUNITY): Payer: Self-pay

## 2016-04-23 ENCOUNTER — Emergency Department (HOSPITAL_COMMUNITY): Payer: Managed Care, Other (non HMO)

## 2016-04-23 DIAGNOSIS — Z79899 Other long term (current) drug therapy: Secondary | ICD-10-CM | POA: Insufficient documentation

## 2016-04-23 DIAGNOSIS — R072 Precordial pain: Secondary | ICD-10-CM | POA: Diagnosis not present

## 2016-04-23 DIAGNOSIS — Z87891 Personal history of nicotine dependence: Secondary | ICD-10-CM | POA: Insufficient documentation

## 2016-04-23 DIAGNOSIS — R109 Unspecified abdominal pain: Secondary | ICD-10-CM | POA: Diagnosis not present

## 2016-04-23 DIAGNOSIS — R079 Chest pain, unspecified: Secondary | ICD-10-CM

## 2016-04-23 LAB — CBC
HEMATOCRIT: 38.5 % (ref 36.0–46.0)
Hemoglobin: 12.7 g/dL (ref 12.0–15.0)
MCH: 29.6 pg (ref 26.0–34.0)
MCHC: 33 g/dL (ref 30.0–36.0)
MCV: 89.7 fL (ref 78.0–100.0)
PLATELETS: 225 10*3/uL (ref 150–400)
RBC: 4.29 MIL/uL (ref 3.87–5.11)
RDW: 12.5 % (ref 11.5–15.5)
WBC: 7 10*3/uL (ref 4.0–10.5)

## 2016-04-23 LAB — BASIC METABOLIC PANEL
Anion gap: 8 (ref 5–15)
BUN: 9 mg/dL (ref 6–20)
CHLORIDE: 105 mmol/L (ref 101–111)
CO2: 25 mmol/L (ref 22–32)
CREATININE: 0.8 mg/dL (ref 0.44–1.00)
Calcium: 9.9 mg/dL (ref 8.9–10.3)
GFR calc non Af Amer: >60 mL/min (ref >60)
Glucose, Bld: 99 mg/dL (ref 65–99)
Potassium: 4.1 mmol/L (ref 3.5–5.1)
Sodium: 138 mmol/L (ref 135–145)

## 2016-04-23 LAB — LIPASE, BLOOD: LIPASE: 27 U/L (ref 11–51)

## 2016-04-23 LAB — HEPATIC FUNCTION PANEL
ALBUMIN: 3.3 g/dL — AB (ref 3.5–5.0)
ALK PHOS: 45 U/L (ref 38–126)
ALT: 12 U/L — AB (ref 14–54)
AST: 18 U/L (ref 15–41)
Bilirubin, Direct: 0.1 mg/dL (ref 0.1–0.5)
Indirect Bilirubin: 0.2 mg/dL — ABNORMAL LOW (ref 0.3–0.9)
TOTAL PROTEIN: 6.7 g/dL (ref 6.5–8.1)
Total Bilirubin: 0.3 mg/dL (ref 0.3–1.2)

## 2016-04-23 LAB — URINALYSIS, ROUTINE W REFLEX MICROSCOPIC
BACTERIA UA: NONE SEEN
BILIRUBIN URINE: NEGATIVE
Glucose, UA: NEGATIVE mg/dL
KETONES UR: NEGATIVE mg/dL
Nitrite: NEGATIVE
PH: 6 (ref 5.0–8.0)
PROTEIN: NEGATIVE mg/dL
Specific Gravity, Urine: 1.01 (ref 1.005–1.030)

## 2016-04-23 LAB — I-STAT TROPONIN, ED
TROPONIN I, POC: 0 ng/mL (ref 0.00–0.08)
Troponin i, poc: 0 ng/mL (ref 0.00–0.08)

## 2016-04-23 LAB — POC URINE PREG, ED: PREG TEST UR: NEGATIVE

## 2016-04-23 MED ORDER — ALUM & MAG HYDROXIDE-SIMETH 200-200-20 MG/5ML PO SUSP
15.0000 mL | Freq: Once | ORAL | Status: AC
  Administered 2016-04-23: 15 mL via ORAL
  Filled 2016-04-23: qty 30

## 2016-04-23 MED ORDER — LIDOCAINE VISCOUS 2 % MT SOLN
15.0000 mL | Freq: Once | OROMUCOSAL | Status: AC
  Administered 2016-04-23: 15 mL via OROMUCOSAL
  Filled 2016-04-23: qty 15

## 2016-04-23 NOTE — ED Provider Notes (Signed)
[facility] DEPT Provider Note   CSN: [id number] Arrival date & time: 04/23/16  1252     History   Chief Complaint Chief Complaint  Patient presents with  . Chest Pain  . Gastroesophageal Reflux    HPI [name] [name] is a 51 y.o. female.  The history is provided by the patient.  Chest Pain   This is a new problem. The current episode started 2 days ago. The problem occurs hourly. The problem has been gradually worsening. Associated with: Not associated with specific event. The pain is present in the substernal region. The pain is at a severity of 5/10. The pain is moderate. The quality of the pain is described as burning (Pt reports a pricking sensation). The pain radiates to the left arm. Exacerbated by: Nothing specifically worsens symptoms. Pertinent negatives include no abdominal pain, no back pain, no cough, no fever, no nausea, no palpitations, no shortness of breath and no vomiting. She has tried nothing for the symptoms. There are no known risk factors.  Pertinent negatives for past medical history include no arrhythmia, no CHF, no MI, no PE, no seizures and no thyroid problem.  Pertinent negatives for family medical history include: no CAD.  Procedure history is negative for cardiac catheterization and stress echo.  Gastroesophageal Reflux  Associated symptoms include chest pain. Pertinent negatives include no abdominal pain and no shortness of breath.    Past Medical History:  Diagnosis Date  . Contraceptive management 11/13/2012  . Frequent UTI    after sex  . Hemorrhoids 04/02/2015  . Pain    back  . Rectal fissure 03/30/2014  . Vertigo     Patient Active Problem List   Diagnosis Date Noted  . Hemorrhoids 04/02/2015  . Rectal fissure 03/30/2014  . Contraceptive management 11/13/2012    Past Surgical History:  Procedure Laterality Date  . CESAREAN SECTION      OB History    Gravida Para Term Preterm AB Living   1 1       1    SAB TAB Ectopic Multiple  Live Births           1       Home Medications    Prior to Admission medications   Medication Sig Start Date End Date Taking? Authorizing Provider  ALPRAZolam Duanne Moron) 0.5 MG tablet TAKE 1/2 TO 1 TABLET BY MOUTH TWICE A DAY IF NEEDED 03/20/14   Nilda Simmer, NP  diazepam (VALIUM) 2 MG tablet Take 1 every 8 hours as needed for vertigo 04/02/15   Estill Dooms, NP  gabapentin (NEURONTIN) 100 MG capsule take 1 capsule three times a day    Kathyrn Drown, MD  naproxen sodium (ANAPROX) 220 MG tablet Take 220 mg by mouth daily as needed (for pain).     Historical Provider, MD  nitrofurantoin, macrocrystal-monohydrate, (MACROBID) 100 MG capsule Take 100 mg by mouth 2 (two) times daily.    Historical Provider, MD  sulfamethoxazole-trimethoprim (BACTRIM DS,SEPTRA DS) 800-160 MG tablet Take 1 tablet by mouth 2 (two) times daily. 01/14/16   Kathyrn Drown, MD  UNABLE TO FIND Osteo Biflex-1 daily    Historical Provider, MD  valACYclovir (VALTREX) 1000 MG tablet 2 po bid 01/14/16   Kathyrn Drown, MD  Marilu Favre 150-35 MCG/24HR transdermal patch apply 1 patch and replace weekly for 3 weeks 04/16/15   Estill Dooms, NP  Marilu Favre 150-35 MCG/24HR transdermal patch APPLY 1 PATCH AND REPLACE WEEKLY FOR 3 WEEKS 03/06/16  Estill Dooms, NP    Family History Family History  Problem Relation Age of Onset  . Diabetes Mother   . Hypertension Mother   . Diabetes Father   . Heart disease Father   . Stroke Father   . Kidney failure Father   . Cirrhosis Father   . Hypertension Brother   . Cancer Maternal Aunt     colon,liver  . Cancer Maternal Uncle     lung  . Heart disease Paternal Grandfather   . Alzheimer's disease Paternal Grandmother     Social History Social History  Substance Use Topics  . Smoking status: Former Smoker    Types: Cigarettes    Start date: 10/01/1987    Quit date: 10/01/1994  . Smokeless tobacco: Never Used  . Alcohol use Yes     Comment: occ     Allergies     Codeine; Keflex [cephalexin]; Penicillins; and Levaquin [levofloxacin in d5w]   Review of Systems Review of Systems  Constitutional: Negative for chills and fever.  HENT: Negative for ear pain and sore throat.   Eyes: Negative for pain and visual disturbance.  Respiratory: Negative for cough and shortness of breath.   Cardiovascular: Positive for chest pain. Negative for palpitations.  Gastrointestinal: Negative for abdominal pain, diarrhea, nausea and vomiting.  Genitourinary: Positive for flank pain. Negative for dysuria and hematuria.  Musculoskeletal: Negative for arthralgias and back pain.  Skin: Negative for color change and rash.  Neurological: Negative for seizures and syncope.  All other systems reviewed and are negative.    Physical Exam Updated Vital Signs BP 109/74   Pulse 87   Temp 97.9 F (36.6 C) (Oral)   Resp 18   Ht 5\' 1"  (1.549 m)   Wt 61.2 kg   LMP 04/09/2016   SpO2 100%   BMI 25.51 kg/m   Physical Exam  Constitutional: She appears well-developed and well-nourished. No distress.  HENT:  Head: Normocephalic and atraumatic.  Eyes: Conjunctivae and EOM are normal.  Neck: Neck supple.  Cardiovascular: Normal rate and regular rhythm.   No murmur heard. Pulmonary/Chest: Effort normal and breath sounds normal. No respiratory distress.  Abdominal: Soft. There is no tenderness. There is no rigidity, no guarding and no CVA tenderness.  Musculoskeletal: She exhibits no edema.  Neurological: She is alert. GCS eye subscore is 4. GCS verbal subscore is 5. GCS motor subscore is 6.  Skin: Skin is warm and dry.  Psychiatric: She has a normal mood and affect.  Nursing note and vitals reviewed.    ED Treatments / Results  Labs (all labs ordered are listed, but only abnormal results are displayed) Labs Reviewed  HEPATIC FUNCTION PANEL - Abnormal; Notable for the following:       Result Value   Albumin 3.3 (*)    ALT 12 (*)    Indirect Bilirubin 0.2 (*)     All other components within normal limits  URINALYSIS, ROUTINE W REFLEX MICROSCOPIC - Abnormal; Notable for the following:    Color, Urine STRAW (*)    Hgb urine dipstick MODERATE (*)    Leukocytes, UA SMALL (*)    Squamous Epithelial / LPF 6-30 (*)    All other components within normal limits  BASIC METABOLIC PANEL  CBC  LIPASE, BLOOD  Randolm Idol, ED  POC URINE PREG, ED  Randolm Idol, ED    EKG  EKG Interpretation  Date/Time:  Sunday April 23 2016 13:00:20 EDT Ventricular Rate:  70 PR Interval:  116 QRS Duration: 72 QT Interval:  376 QTC Calculation: 406 R Axis:   76 Text Interpretation:  Normal sinus rhythm No significant change since last tracing Artifact Borderline ECG Confirmed by Carmin Muskrat  MD [phone]) on 04/23/2016 1:07:26 PM       Radiology Dg Chest 2 View  Result Date: 04/23/2016 CLINICAL DATA:  Intermittent lightheadedness. EXAM: CHEST  2 VIEW COMPARISON:  November 17, 2014 FINDINGS: The heart size and mediastinal contours are within normal limits. Both lungs are clear. The visualized skeletal structures are unremarkable. IMPRESSION: No active cardiopulmonary disease. Electronically Signed   By: Dorise Bullion III M.D   On: 04/23/2016 14:34    Procedures Procedures (including critical care time)  Medications Ordered in ED Medications  lidocaine (XYLOCAINE) 2 % viscous mouth solution 15 mL (15 mLs Mouth/Throat Given 04/23/16 1704)  alum & mag hydroxide-simeth (MAALOX/MYLANTA) 200-200-20 MG/5ML suspension 15 mL (15 mLs Oral Given 04/23/16 1704)     Initial Impression / Assessment and Plan / ED Course  I have reviewed the triage vital signs and the nursing notes.  Pertinent labs & imaging results that were available during my care of the patient were reviewed by me and considered in my medical decision making (see chart for details).     51 year old white female with no significant past medical history presented in the setting of chest discomfort.  Patient reports over the last 2 days she's had intermittent retrosternal chest discomfort. She reports is a burning sensation with some sharp and tingling sensation. She reports at times is radiated to left arm. She additionally reports some left flank pain. Patient denies taking any intervention for this and is not a tobacco abuser.  On arrival patient was hemodynamically stable and afebrile. EKG revealed normal sinus rhythm and no signs of acute ischemia, ST segment elevation or depression. No significant interval abdomen out of. Chest x-ray revealed no acute cardiopulmonary abdomen mildly. Troponin at 0 and 3 hours without elevation. No significant I amount is noted. No signs of urinary tract infection. No signs of pancreatitis. The patient reported symptoms would come and go without obvious etiology. Patient was given GI cocktail with significant improvement in symptoms. Patient is low risk for heart score. I have low suspicion for ACS or other acute cardiac etiology of patient's symptoms. However patient will need close follow-up with primary care physician for further management of these issues.   Additionally patient given number for cardiologist. Patient reports she will see her primary care physician and strict return cautions were given. Patient was stable at time of discharge and in agreement with plan.  Final Clinical Impressions(s) / ED Diagnoses   Final diagnoses:  Chest pain, unspecified type  Left flank pain    New Prescriptions New Prescriptions   No medications on file     Esaw Grandchild, MD 04/23/16 [place], MD 04/24/16 [phone]

## 2016-05-11 ENCOUNTER — Encounter: Payer: Self-pay | Admitting: Nurse Practitioner

## 2016-05-11 ENCOUNTER — Ambulatory Visit (INDEPENDENT_AMBULATORY_CARE_PROVIDER_SITE_OTHER): Payer: 59 | Admitting: Nurse Practitioner

## 2016-05-11 VITALS — BP 144/80 | Temp 98.1°F | Ht 61.0 in | Wt 130.1 lb

## 2016-05-11 DIAGNOSIS — N3090 Cystitis, unspecified without hematuria: Secondary | ICD-10-CM

## 2016-05-11 DIAGNOSIS — R309 Painful micturition, unspecified: Secondary | ICD-10-CM | POA: Diagnosis not present

## 2016-05-11 LAB — POCT URINALYSIS DIPSTICK
Leukocytes, UA: NEGATIVE
PH UA: 5 (ref 5.0–8.0)
RBC UA: NEGATIVE
SPEC GRAV UA: 1.025 (ref 1.010–1.025)

## 2016-05-11 MED ORDER — SULFAMETHOXAZOLE-TRIMETHOPRIM 800-160 MG PO TABS: 1.0000 | ORAL_TABLET | Freq: Two times a day (BID) | ORAL | 0 refills | Status: DC

## 2016-05-12 ENCOUNTER — Encounter: Payer: Self-pay | Admitting: Nurse Practitioner

## 2016-05-12 NOTE — Progress Notes (Signed)
Subjective:  Presents for complaints of dysuria urgency frequency for the past week. Occasional pressure in the mid pelvic area. No fever. Some mid back pain. No nausea vomiting. No new sexual partners. No vaginal discharge. Has been having irregular cycles. Uses patch for birth control. Has an appointment on 5/1 with gynecology for her physical. Has been seen for recurrent dysuria, 4 times in 2017. Requesting urology referral.  Objective:   BP (!) 144/80   Temp 98.1 F (36.7 C) (Oral)   Ht 5\' 1"  (1.549 m)   Wt 130 lb 2 oz (59 kg)   BMI 24.59 kg/m  NAD. Alert, oriented. Lungs clear. Heart regular rate rhythm. No CVA or flank tenderness. Abdomen soft nondistended with suprapubic area discomfort on exam. Results for orders placed or performed in visit on 05/11/16  POCT urinalysis dipstick  Result Value Ref Range   Color, UA Yellow    Clarity, UA Clear    Glucose, UA     Bilirubin, UA     Ketones, UA     Spec Grav, UA 1.025 1.010 - 1.025   Blood, UA negative    pH, UA 5.0 5.0 - 8.0   Protein, UA     Urobilinogen, UA  0.2 or 1.0 E.U./dL   Nitrite, UA     Leukocytes, UA Negative Negative   Urine micro-negative. Urine culture done 12/08/2015 was negative. Patient states antibiotics did help her symptoms at that time.  Assessment:  Painful urination - Plan: POCT urinalysis dipstick  Recurrent cystitis - Plan: Ambulatory referral to Urology    Plan:   Meds ordered this encounter  Medications  . sulfamethoxazole-trimethoprim (BACTRIM DS,SEPTRA DS) 800-160 MG tablet    Sig: Take 1 tablet by mouth 2 (two) times daily.    Dispense:  14 tablet    Refill:  0    Order Specific Question:   Supervising Provider    Answer:   Maggie Font   May use AZO for 48 hours then discontinue. Warning signs reviewed. Call back in 4 days if no improvement, sooner if worse.  Follow-up with gynecology next week as planned for evaluation including discharge, possible cystocele or prolapsed  uterus. Proceed with referral to urology. Recheck here as needed.

## 2016-05-16 ENCOUNTER — Ambulatory Visit (INDEPENDENT_AMBULATORY_CARE_PROVIDER_SITE_OTHER): Payer: 59 | Admitting: Adult Health

## 2016-05-16 ENCOUNTER — Encounter: Payer: Self-pay | Admitting: Adult Health

## 2016-05-16 VITALS — BP 122/82 | HR 64 | Ht 61.25 in | Wt 128.0 lb

## 2016-05-16 DIAGNOSIS — Z1211 Encounter for screening for malignant neoplasm of colon: Secondary | ICD-10-CM | POA: Diagnosis not present

## 2016-05-16 DIAGNOSIS — Z3045 Encounter for surveillance of transdermal patch hormonal contraceptive device: Secondary | ICD-10-CM

## 2016-05-16 DIAGNOSIS — Z1212 Encounter for screening for malignant neoplasm of rectum: Secondary | ICD-10-CM

## 2016-05-16 DIAGNOSIS — Z01419 Encounter for gynecological examination (general) (routine) without abnormal findings: Secondary | ICD-10-CM | POA: Diagnosis not present

## 2016-05-16 LAB — HEMOCCULT GUIAC POC 1CARD (OFFICE): FECAL OCCULT BLD: NEGATIVE

## 2016-05-16 MED ORDER — NORELGESTROMIN-ETH ESTRADIOL 150-35 MCG/24HR TD PTWK: MEDICATED_PATCH | TRANSDERMAL | 12 refills | Status: DC

## 2016-05-16 NOTE — Progress Notes (Signed)
Patient ID: [name] [name], female   DOB: 01/26/1965, 51 y.o.   MRN: [id number] History of Present Illness: [name] [name] is a 51 year old white female, in for well woman gyn exam, had normal pap with negative HPV 04/02/15.  PCP C.Sumner Boast, NP.   Current Medications, Allergies, Past Medical History, Past Surgical History, Family History and Social History were reviewed in [employer] record.     Review of Systems: Patient denies any headaches, hearing loss, fatigue, blurred vision, shortness of breath, chest pain, abdominal pain, problems with bowel movements, or intercourse. No joint pain or mood swings.Periods light on the patch, last one was only 3 days, instead of 6-7. Has frequency and burning and itching with urination, was seen last week at PCP, has not taken antibiotic yet, and says getting urology referral.    Physical Exam:BP 122/82 (BP Location: Right Arm, Patient Position: Sitting, Cuff Size: Normal)   Pulse 64   Ht 5' 1.25" (1.556 m)   Wt 128 lb (58.1 kg)   LMP 05/11/2016 (Exact Date)   BMI 23.99 kg/m  General:  Well developed, well nourished, no acute distress Skin:  Warm and dry Neck:  Midline trachea, normal thyroid, good ROM, no lymphadenopathy Lungs; Clear to auscultation bilaterally Breast:  No dominant palpable mass, retraction, or nipple discharge,bilateral inverted nipples Cardiovascular: Regular rate and rhythm Abdomen:  Soft, non tender, no hepatosplenomegaly Pelvic:  External genitalia is normal in appearance, no lesions.  The vagina is normal in appearance. Urethra has no lesions or masses. The cervix is smooth.  Uterus is felt to be normal size, shape, and contour.  No adnexal masses or tenderness noted.Bladder is non tender, no masses felt. Rectal: Good sphincter tone, no polyps, or hemorrhoids felt.  Hemoccult negative. Extremities/musculoskeletal:  No swelling or varicosities noted, no clubbing or cyanosis Psych:  No mood changes, alert and  cooperative,seems happy PHQ 2 score 0.   Impression: 1. Well woman exam with routine gynecological exam   2. Encounter for surveillance of transdermal patch hormonal contraceptive device   3. Screening for colorectal cancer       Plan: Refilled xulane x 1 year Mammogram(past due) now and yearly Labs in future Colonoscopy advised

## 2016-05-17 ENCOUNTER — Encounter: Payer: Self-pay | Admitting: Family Medicine

## 2016-05-22 ENCOUNTER — Telehealth: Payer: Self-pay | Admitting: *Deleted

## 2016-05-22 NOTE — Telephone Encounter (Signed)
Pt wants to try some vaginal estrogen to see if helps with burning the recent UTIs.To come by tomorrow for sample of either estrace or premarin cream

## 2016-05-23 ENCOUNTER — Telehealth: Payer: Self-pay | Admitting: Adult Health

## 2016-05-23 MED ORDER — ESTROGENS, CONJUGATED 0.625 MG/GM VA CREA: TOPICAL_CREAM | VAGINAL | 0 refills | Status: DC

## 2016-05-23 NOTE — Telephone Encounter (Signed)
Pt called complaining of vaginal dryness, and sex not has enjoyable and more UTIs,will try PVC,samples given 4 tubes,= 16 gm, use .5 gm in vagina for 1 week then 2-3 x weekly, lot [id number] exp 4/19

## 2016-10-17 ENCOUNTER — Ambulatory Visit (INDEPENDENT_AMBULATORY_CARE_PROVIDER_SITE_OTHER): Payer: Self-pay | Admitting: Family Medicine

## 2016-10-17 ENCOUNTER — Encounter: Payer: Self-pay | Admitting: Family Medicine

## 2016-10-17 VITALS — BP 124/84 | Temp 98.9°F | Ht 61.0 in | Wt 130.0 lb

## 2016-10-17 DIAGNOSIS — J310 Chronic rhinitis: Secondary | ICD-10-CM

## 2016-10-17 DIAGNOSIS — J329 Chronic sinusitis, unspecified: Secondary | ICD-10-CM

## 2016-10-17 MED ORDER — SULFAMETHOXAZOLE-TRIMETHOPRIM 800-160 MG PO TABS: 1.0000 | ORAL_TABLET | Freq: Two times a day (BID) | ORAL | 0 refills | Status: DC

## 2016-10-17 NOTE — Progress Notes (Signed)
   Subjective:    Patient ID: [name] [name], female    DOB: 1965-03-22, 51 y.o.   MRN: [id number]  Sinus Problem   patient here today states she has had sinus problems for 2 weeks now. She is experiencing drainage,cough (productive green in color),stopped up nose/runny nose,itchy eyes.  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 Hydration good Vitals stable. frontal/ maxillary tenderness evident positive nasal congestion. pharynx normal neck supple  lungs clear/no crackles or wheezes. heart regular in rhythm       Assessment & Plan:  Impression rhinosinusitis likely post viral, discussed with patient. plan antibiotics prescribed. Questions answered. Symptomatic care discussed. warning signs discussed. WSL

## 2016-10-19 ENCOUNTER — Telehealth: Payer: Self-pay | Admitting: Family Medicine

## 2016-10-19 MED ORDER — DOXYCYCLINE HYCLATE 100 MG PO TABS: 100.0000 mg | ORAL_TABLET | Freq: Two times a day (BID) | ORAL | 0 refills | Status: DC

## 2016-10-19 NOTE — Telephone Encounter (Signed)
Spoke with patient and informed her per Dr.Scott Luking- More than likely this is referred pain to the ears from the sinuses For now. Bactrim. Recommend doxycycline 100 mg twice a day for the next 10 days, take with the snack and a tall glass of liquids not milk may use allergy tablets daily when necessary loratadine 10 mg, follow-up if ongoing-Tylenol are present for the pain if pain not better by early next week I recommend recheck. Patient verbalized understanding.

## 2016-10-19 NOTE — Telephone Encounter (Signed)
Pt called stating that she is having really sharp pains in her ear. Pt was seen Tues for uri and was given bactrim. Please advise.

## 2016-10-19 NOTE — Telephone Encounter (Signed)
Spoke with patient and patient stated that she has pain in right ear that goes up to her right temporal region. Patient denies any other symptoms. She is still currently taking Bactrim

## 2016-10-19 NOTE — Telephone Encounter (Signed)
Left message return call 10/19/16 

## 2016-10-19 NOTE — Telephone Encounter (Signed)
More than likely this is referred pain to the ears from the sinuses For now. Bactrim. Recommend doxycycline 100 mg twice a day for the next 10 days, take with the snack and a tall glass of liquids not milk may use allergy tablets daily when necessary loratadine 10 mg, follow-up if ongoing-Tylenol are present for the pain if pain not better by early next week I recommend recheck

## 2016-12-22 ENCOUNTER — Other Ambulatory Visit: Payer: Self-pay | Admitting: Adult Health

## 2016-12-28 ENCOUNTER — Ambulatory Visit (INDEPENDENT_AMBULATORY_CARE_PROVIDER_SITE_OTHER): Payer: Managed Care, Other (non HMO) | Admitting: Nurse Practitioner

## 2016-12-28 ENCOUNTER — Encounter: Payer: Self-pay | Admitting: Nurse Practitioner

## 2016-12-28 VITALS — BP 132/80 | Temp 98.2°F | Ht 61.0 in | Wt 135.0 lb

## 2016-12-28 DIAGNOSIS — R300 Dysuria: Secondary | ICD-10-CM

## 2016-12-28 LAB — POCT URINALYSIS DIPSTICK
SPEC GRAV UA: 1.025 (ref 1.010–1.025)
pH, UA: 7 (ref 5.0–8.0)

## 2016-12-28 LAB — POCT UA - MICROSCOPIC ONLY

## 2016-12-28 MED ORDER — MELOXICAM 15 MG PO TABS: 15.0000 mg | ORAL_TABLET | Freq: Every day | ORAL | 0 refills | Status: DC

## 2016-12-28 NOTE — Patient Instructions (Signed)
Interstitial cystitis

## 2016-12-29 ENCOUNTER — Telehealth: Payer: Self-pay | Admitting: Nurse Practitioner

## 2016-12-29 ENCOUNTER — Other Ambulatory Visit: Payer: Self-pay | Admitting: Nurse Practitioner

## 2016-12-29 MED ORDER — IBUPROFEN 800 MG PO TABS: 800.0000 mg | ORAL_TABLET | Freq: Three times a day (TID) | ORAL | 0 refills | Status: DC | PRN

## 2016-12-29 NOTE — Telephone Encounter (Signed)
See second note dated 12/29/2016.

## 2016-12-29 NOTE — Telephone Encounter (Signed)
Please advise 

## 2016-12-29 NOTE — Telephone Encounter (Signed)
Sent in Ibuprofen so she can switch from Naproxen. Call back next week if no better. Still waiting on urine culture results.

## 2016-12-29 NOTE — Telephone Encounter (Signed)
I called and left a vm asked that she r/c. 

## 2016-12-29 NOTE — Telephone Encounter (Signed)
Patient is aware 

## 2016-12-29 NOTE — Telephone Encounter (Signed)
Pt called stating that the anti-inflammatory she was prescribed yesterday is not touching the pain. Pt is requesting something stronger. Please advise.

## 2016-12-29 NOTE — Telephone Encounter (Signed)
Would she like to try another anti inflammatory or pain med? Also, recommend recheck with urology. Is she having cycles using her patch? When was her last one?

## 2016-12-29 NOTE — Telephone Encounter (Signed)
Patient returned our call and the nurse was busy so I asked her the questions you had asked.  Patient doesn't really want to take pain meds cause they make her sleepy at work. Maybe stronger anti-inflammatory.  Said she had been on Ibuprofen 800 mg before and that was ok. Also said cycles are ok and next is due next week. I also told her that you suggested recheck with urologist.  She uses [facility] and her return # is [phone].

## 2016-12-30 ENCOUNTER — Encounter: Payer: Self-pay | Admitting: Nurse Practitioner

## 2016-12-30 ENCOUNTER — Other Ambulatory Visit: Payer: Self-pay | Admitting: Nurse Practitioner

## 2016-12-30 MED ORDER — SULFAMETHOXAZOLE-TRIMETHOPRIM 800-160 MG PO TABS: 1.0000 | ORAL_TABLET | Freq: Two times a day (BID) | ORAL | 0 refills | Status: DC

## 2016-12-30 NOTE — Progress Notes (Signed)
Subjective:  Presents for c/o urinary symptoms x 3 weeks. Has been taking Urostat. No fever. Pressure with urgency and frequency. No incontinence. Last GYN exam normal. Same sexual partner. No vaginal DC. No back or flank pain. No N/V or diarrhea. Has seen urology in the past; diagnosed with inflammation of the pelvic muscles; went away with NSAID use. Taking fluids well.   Objective:   BP 132/80   Temp 98.2 F (36.8 C) (Oral)   Ht 5\' 1"  (1.549 m)   Wt 135 lb (61.2 kg)   BMI 25.51 kg/m  NAD. Alert, oriented. Lungs clear. Heart RRR. No CVA tenderness. Mild suprapubic area tenderness. Results for orders placed or performed in visit on 12/28/16                             POCT urinalysis dipstick  Result Value Ref Range   Color, UA     Clarity, UA     Glucose, UA     Bilirubin, UA     Ketones, UA     Spec Grav, UA 1.025 1.010 - 1.025   Blood, UA     pH, UA 7.0 5.0 - 8.0   Protein, UA     Urobilinogen, UA  0.2 or 1.0 E.U./dL   Nitrite, UA     Leukocytes, UA  Negative   Appearance     Odor    POCT UA - Microscopic Only  Result Value Ref Range   WBC, Ur, HPF, POC rare    RBC, urine, microscopic rare    Bacteria, U Microscopic occas    Mucus, UA     Epithelial cells, urine per micros     Crystals, Ur, HPF, POC     Casts, Ur, LPF, POC     Yeast, UA       Assessment:  Dysuria - Plan: POCT urinalysis dipstick, POCT UA - Microscopic Only, Urine Culture, CANCELED: Urine Culture    Plan:   Meds ordered this encounter  Medications  . meloxicam (MOBIC) 15 MG tablet    Sig: Take 1 tablet (15 mg total) by mouth daily. Prn pain    Dispense:  30 tablet    Refill:  0    Order Specific Question:   Supervising Provider    Answer:   Mikey Kirschner [[id number]]   Urine culture pending. Hold on antibiotics until report. Continue Urostat for now. Warning signs reviewed. Go to ED or urgent care this weekend if worse.

## 2016-12-31 LAB — URINE CULTURE

## 2016-12-31 LAB — SPECIMEN STATUS REPORT

## 2017-01-01 ENCOUNTER — Other Ambulatory Visit: Payer: Self-pay | Admitting: Nurse Practitioner

## 2017-01-01 MED ORDER — NITROFURANTOIN MONOHYD MACRO 100 MG PO CAPS: 100.0000 mg | ORAL_CAPSULE | Freq: Two times a day (BID) | ORAL | 0 refills | Status: DC

## 2017-01-01 MED ORDER — FLUCONAZOLE 150 MG PO TABS: ORAL_TABLET | ORAL | 0 refills | Status: DC

## 2017-04-16 ENCOUNTER — Telehealth: Payer: Self-pay | Admitting: *Deleted

## 2017-04-16 MED ORDER — NITROFURANTOIN MONOHYD MACRO 100 MG PO CAPS: ORAL_CAPSULE | ORAL | 3 refills | Status: DC

## 2017-04-16 NOTE — Telephone Encounter (Signed)
Left message that Macrobid sent to [facility]

## 2017-04-24 ENCOUNTER — Encounter: Payer: Self-pay | Admitting: Family Medicine

## 2017-04-24 ENCOUNTER — Ambulatory Visit (INDEPENDENT_AMBULATORY_CARE_PROVIDER_SITE_OTHER): Payer: Managed Care, Other (non HMO) | Admitting: Family Medicine

## 2017-04-24 VITALS — BP 130/80 | Temp 98.2°F | Wt 133.2 lb

## 2017-04-24 DIAGNOSIS — R519 Headache, unspecified: Secondary | ICD-10-CM

## 2017-04-24 DIAGNOSIS — G5622 Lesion of ulnar nerve, left upper limb: Secondary | ICD-10-CM | POA: Diagnosis not present

## 2017-04-24 DIAGNOSIS — Z13220 Encounter for screening for lipoid disorders: Secondary | ICD-10-CM | POA: Diagnosis not present

## 2017-04-24 DIAGNOSIS — M25551 Pain in right hip: Secondary | ICD-10-CM

## 2017-04-24 DIAGNOSIS — R410 Disorientation, unspecified: Secondary | ICD-10-CM

## 2017-04-24 DIAGNOSIS — R51 Headache: Secondary | ICD-10-CM | POA: Diagnosis not present

## 2017-04-24 DIAGNOSIS — R5383 Other fatigue: Secondary | ICD-10-CM | POA: Diagnosis not present

## 2017-04-24 MED ORDER — ALPRAZOLAM 0.5 MG PO TABS: ORAL_TABLET | ORAL | 2 refills | Status: DC

## 2017-04-24 NOTE — Patient Instructions (Signed)
DASH Eating Plan DASH stands for "Dietary Approaches to Stop Hypertension." The DASH eating plan is a healthy eating plan that has been shown to reduce high blood pressure (hypertension). It may also reduce your risk for type 2 diabetes, heart disease, and stroke. The DASH eating plan may also help with weight loss. What are tips for following this plan? General guidelines  Avoid eating more than 2,300 mg (milligrams) of salt (sodium) a day. If you have hypertension, you may need to reduce your sodium intake to 1,500 mg a day.  Limit alcohol intake to no more than 1 drink a day for nonpregnant women and 2 drinks a day for men. One drink equals 12 oz of beer, 5 oz of wine, or 1 oz of hard liquor.  Work with your health care provider to maintain a healthy body weight or to lose weight. Ask what an ideal weight is for you.  Get at least 30 minutes of exercise that causes your heart to beat faster (aerobic exercise) most days of the week. Activities may include walking, swimming, or biking.  Work with your health care provider or diet and nutrition specialist (dietitian) to adjust your eating plan to your individual calorie needs. Reading food labels  Check food labels for the amount of sodium per serving. Choose foods with less than 5 percent of the Daily Value of sodium. Generally, foods with less than 300 mg of sodium per serving fit into this eating plan.  To find whole grains, look for the word "whole" as the first word in the ingredient list. Shopping  Buy products labeled as "low-sodium" or "no salt added."  Buy fresh foods. Avoid canned foods and premade or frozen meals. Cooking  Avoid adding salt when cooking. Use salt-free seasonings or herbs instead of table salt or sea salt. Check with your health care provider or pharmacist before using salt substitutes.  Do not fry foods. Cook foods using healthy methods such as baking, boiling, grilling, and broiling instead.  Cook with  heart-healthy oils, such as olive, canola, soybean, or sunflower oil. Meal planning   Eat a balanced diet that includes: ? 5 or more servings of fruits and vegetables each day. At each meal, try to fill half of your plate with fruits and vegetables. ? Up to 6-8 servings of whole grains each day. ? Less than 6 oz of lean meat, poultry, or fish each day. A 3-oz serving of meat is about the same size as a deck of cards. One egg equals 1 oz. ? 2 servings of low-fat dairy each day. ? A serving of nuts, seeds, or beans 5 times each week. ? Heart-healthy fats. Healthy fats called Omega-3 fatty acids are found in foods such as flaxseeds and coldwater fish, like sardines, salmon, and mackerel.  Limit how much you eat of the following: ? Canned or prepackaged foods. ? Food that is high in trans fat, such as fried foods. ? Food that is high in saturated fat, such as fatty meat. ? Sweets, desserts, sugary drinks, and other foods with added sugar. ? Full-fat dairy products.  Do not salt foods before eating.  Try to eat at least 2 vegetarian meals each week.  Eat more home-cooked food and less restaurant, buffet, and fast food.  When eating at a restaurant, ask that your food be prepared with less salt or no salt, if possible. What foods are recommended? The items listed may not be a complete list. Talk with your dietitian about what   dietary choices are best for you. Grains Whole-grain or whole-wheat bread. Whole-grain or whole-wheat pasta. Brown rice. Oatmeal. Quinoa. Bulgur. Whole-grain and low-sodium cereals. Pita bread. Low-fat, low-sodium crackers. Whole-wheat flour tortillas. Vegetables Fresh or frozen vegetables (raw, steamed, roasted, or grilled). Low-sodium or reduced-sodium tomato and vegetable juice. Low-sodium or reduced-sodium tomato sauce and tomato paste. Low-sodium or reduced-sodium canned vegetables. Fruits All fresh, dried, or frozen fruit. Canned fruit in natural juice (without  added sugar). Meat and other protein foods Skinless chicken or turkey. Ground chicken or turkey. Pork with fat trimmed off. Fish and seafood. Egg whites. Dried beans, peas, or lentils. Unsalted nuts, nut butters, and seeds. Unsalted canned beans. Lean cuts of beef with fat trimmed off. Low-sodium, lean deli meat. Dairy Low-fat (1%) or fat-free (skim) milk. Fat-free, low-fat, or reduced-fat cheeses. Nonfat, low-sodium ricotta or cottage cheese. Low-fat or nonfat yogurt. Low-fat, low-sodium cheese. Fats and oils Soft margarine without trans fats. Vegetable oil. Low-fat, reduced-fat, or light mayonnaise and salad dressings (reduced-sodium). Canola, safflower, olive, soybean, and sunflower oils. Avocado. Seasoning and other foods Herbs. Spices. Seasoning mixes without salt. Unsalted popcorn and pretzels. Fat-free sweets. What foods are not recommended? The items listed may not be a complete list. Talk with your dietitian about what dietary choices are best for you. Grains Baked goods made with fat, such as croissants, muffins, or some breads. Dry pasta or rice meal packs. Vegetables Creamed or fried vegetables. Vegetables in a cheese sauce. Regular canned vegetables (not low-sodium or reduced-sodium). Regular canned tomato sauce and paste (not low-sodium or reduced-sodium). Regular tomato and vegetable juice (not low-sodium or reduced-sodium). Pickles. Olives. Fruits Canned fruit in a light or heavy syrup. Fried fruit. Fruit in cream or butter sauce. Meat and other protein foods Fatty cuts of meat. Ribs. Fried meat. Bacon. Sausage. Bologna and other processed lunch meats. Salami. Fatback. Hotdogs. Bratwurst. Salted nuts and seeds. Canned beans with added salt. Canned or smoked fish. Whole eggs or egg yolks. Chicken or turkey with skin. Dairy Whole or 2% milk, cream, and half-and-half. Whole or full-fat cream cheese. Whole-fat or sweetened yogurt. Full-fat cheese. Nondairy creamers. Whipped toppings.  Processed cheese and cheese spreads. Fats and oils Butter. Stick margarine. Lard. Shortening. Ghee. Bacon fat. Tropical oils, such as coconut, palm kernel, or palm oil. Seasoning and other foods Salted popcorn and pretzels. Onion salt, garlic salt, seasoned salt, table salt, and sea salt. Worcestershire sauce. Tartar sauce. Barbecue sauce. Teriyaki sauce. Soy sauce, including reduced-sodium. Steak sauce. Canned and packaged gravies. Fish sauce. Oyster sauce. Cocktail sauce. Horseradish that you find on the shelf. Ketchup. Mustard. Meat flavorings and tenderizers. Bouillon cubes. Hot sauce and Tabasco sauce. Premade or packaged marinades. Premade or packaged taco seasonings. Relishes. Regular salad dressings. Where to find more information:  National Heart, Lung, and Blood Institute: www.nhlbi.nih.gov  American Heart Association: www.heart.org Summary  The DASH eating plan is a healthy eating plan that has been shown to reduce high blood pressure (hypertension). It may also reduce your risk for type 2 diabetes, heart disease, and stroke.  With the DASH eating plan, you should limit salt (sodium) intake to 2,300 mg a day. If you have hypertension, you may need to reduce your sodium intake to 1,500 mg a day.  When on the DASH eating plan, aim to eat more fresh fruits and vegetables, whole grains, lean proteins, low-fat dairy, and heart-healthy fats.  Work with your health care provider or diet and nutrition specialist (dietitian) to adjust your eating plan to your individual   calorie needs. This information is not intended to replace advice given to you by your health care provider. Make sure you discuss any questions you have with your health care provider. Document Released: 12/22/2010 Document Revised: 12/27/2015 Document Reviewed: 12/27/2015 Elsevier Interactive Patient Education  2018 Elsevier Inc.  

## 2017-04-24 NOTE — Progress Notes (Signed)
Subjective:    Patient ID: [name] [name], female    DOB: [birth year], 52 y.o.   MRN: [id number]  HPI  Patient arrives with headache for 2 weeks. Patient has also been having pain and tingling in her left ar and pain in her right hip area. She denies being depressed but she states she is very stressed has to do a lot of work does not like her job has a long commute plus also takes care of family members she denies blurred vision she denies sweats chills nausea though she does relate does not feel well she denies high fever she also states she had 2 different spells where she had a hard time thinking one time where she felt like she just could not focus and concentrate in the process what was going on another time where she states she pumped gas she went into pain she could not put together the word she was thinking and that lasted approximately 10 minutes and then gradually went away over the next 20 minutes  She has never had that spell before.  She denies unilateral numbness or weakness.  She denies double vision or blurred vision.  Does not wake her up at night.  She does have a family history of stroke.  Does have some risk factors including age and family history  The patient also relates numbness and tingling in the left arm that goes down into the ring finger in the small finger does not cause any difficulty using the hand.  Denies any wrist pain or elbow pain.  Denies neck pain or discomfort. She also does a lot of work lifting moving and is having some intermittent right hip stiffness using OTC measures for this  Cholesterol profile and sugar in the past have been normal Patient also relates a lot of stress anxiousness nervousness and she takes Xanax occasionally for this is requesting a refill she does not want to be on any type of antidepressant but she will consider it we did discuss Zoloft at length Review of Systems  Constitutional: Negative for activity change, appetite change and  fatigue.  HENT: Negative for congestion and rhinorrhea.   Respiratory: Negative for cough and wheezing.   Cardiovascular: Negative for chest pain and leg swelling.  Gastrointestinal: Negative for abdominal pain, blood in stool, diarrhea and nausea.  Endocrine: Negative for polydipsia and polyphagia.  Skin: Negative for color change.  Neurological: Positive for dizziness, speech difficulty, light-headedness, numbness and headaches. Negative for syncope, facial asymmetry and weakness.  Psychiatric/Behavioral: Negative for confusion.       Objective:   Physical Exam  Constitutional: She appears well-developed and well-nourished. No distress.  HENT:  Head: Normocephalic and atraumatic.  Eyes: Right eye exhibits no discharge. Left eye exhibits no discharge.  Neck: No tracheal deviation present.  Cardiovascular: Normal rate, regular rhythm and normal heart sounds.  No murmur heard. Pulmonary/Chest: Effort normal and breath sounds normal. No respiratory distress. She has no wheezes. She has no rales.  Musculoskeletal: She exhibits no edema.  Lymphadenopathy:    She has no cervical adenopathy.  Neurological: She is alert. She exhibits normal muscle tone.  Skin: Skin is warm and dry. No erythema.  Psychiatric: Her behavior is normal.  Vitals reviewed. No unilateral weakness or numbness no ataxia strength good bilateral reflexes good   25 minutes was spent with the patient.  This statement verifies that 25 minutes was indeed spent with the patient. Greater than half the time was spent in  discussion, counseling and answering questions  regarding the issues that the patient came in for today as reflected in the diagnosis (s) please refer to documentation for further details.      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 study with neurology  Severe headache with cognitive issue possible TIA recommend 81 mg aspirin hold off on any other anti-inflammatories in  addition to this recommend that the patient see neurology for further evaluation and consultation lab work ordered including kidney function and cholesterol in addition to this MRI is indicated to rule out the possibility of a stroke warning signs regarding stroke was discussed with the patient patient was instructed to go the ER should this occur  Generalized anxiety disorder with panic attacks patient would benefit from being on Zoloft but she defers on this currently she does except a prescription for Xanax to use when she has at home not with any other activity in the follow-up if any progressive troubles

## 2017-04-25 ENCOUNTER — Encounter: Payer: Self-pay | Admitting: Family Medicine

## 2017-05-01 ENCOUNTER — Encounter: Payer: Self-pay | Admitting: Family Medicine

## 2017-05-02 ENCOUNTER — Telehealth: Payer: Self-pay | Admitting: Family Medicine

## 2017-05-02 LAB — BASIC METABOLIC PANEL
BUN/Creatinine Ratio: 10 (ref 9–23)
BUN: 8 mg/dL (ref 6–24)
CHLORIDE: 103 mmol/L (ref 96–106)
CO2: 23 mmol/L (ref 20–29)
Calcium: 9.4 mg/dL (ref 8.7–10.2)
Creatinine, Ser: 0.77 mg/dL (ref 0.57–1.00)
GFR calc Af Amer: 103 mL/min/{1.73_m2} (ref >59)
GFR, EST NON AFRICAN AMERICAN: 90 mL/min/{1.73_m2} (ref >59)
Glucose: 91 mg/dL (ref 65–99)
POTASSIUM: 4.4 mmol/L (ref 3.5–5.2)
Sodium: 137 mmol/L (ref 134–144)

## 2017-05-02 LAB — LIPID PANEL
CHOL/HDL RATIO: 2.4 ratio (ref 0.0–4.4)
Cholesterol, Total: 172 mg/dL (ref 100–199)
HDL: 73 mg/dL (ref >39)
LDL CALC: 81 mg/dL (ref 0–99)
Triglycerides: 91 mg/dL (ref 0–149)
VLDL CHOLESTEROL CAL: 18 mg/dL (ref 5–40)

## 2017-05-02 LAB — SEDIMENTATION RATE: SED RATE: 2 mm/h (ref 0–40)

## 2017-05-02 LAB — TSH: TSH: 0.997 u[IU]/mL (ref 0.450–4.500)

## 2017-05-02 NOTE — Telephone Encounter (Addendum)
Review MRI Brain w/o contrast from [facility] in results folder.

## 2017-05-03 ENCOUNTER — Encounter: Payer: Self-pay | Admitting: Family Medicine

## 2017-05-03 NOTE — Telephone Encounter (Signed)
MRI did not show any tumor or any type of growth.  There are nonspecific changes present but this is not unusual for some individuals as a get older not a sign of any worry.  It is recommended for the patient to be seen by neurology because of her spell-I did put in referral for this please make sure patient is getting appointment with neurology

## 2017-05-03 NOTE — Telephone Encounter (Signed)
Left msg to return call

## 2017-05-03 NOTE — Telephone Encounter (Signed)
See in yellow basket.

## 2017-05-03 NOTE — Telephone Encounter (Signed)
I have not seen this year please somebody track this down to show it to me

## 2017-05-04 NOTE — Telephone Encounter (Signed)
I called and left a message to r/c. 

## 2017-05-07 NOTE — Telephone Encounter (Signed)
Pt states neurology called and left her a message and she will call them back.

## 2017-05-07 NOTE — Telephone Encounter (Signed)
Discussed with pt. Pt verbalized understanding.  °

## 2017-05-13 ENCOUNTER — Other Ambulatory Visit: Payer: Self-pay | Admitting: Adult Health

## 2017-05-16 ENCOUNTER — Encounter: Payer: Self-pay | Admitting: Family Medicine

## 2017-05-16 ENCOUNTER — Ambulatory Visit (INDEPENDENT_AMBULATORY_CARE_PROVIDER_SITE_OTHER): Payer: Managed Care, Other (non HMO) | Admitting: Family Medicine

## 2017-05-16 VITALS — BP 128/72 | Temp 98.0°F | Ht 61.0 in | Wt 130.0 lb

## 2017-05-16 DIAGNOSIS — N390 Urinary tract infection, site not specified: Secondary | ICD-10-CM

## 2017-05-16 DIAGNOSIS — R300 Dysuria: Secondary | ICD-10-CM

## 2017-05-16 LAB — POCT URINALYSIS DIPSTICK
PH UA: 7 (ref 5.0–8.0)
RBC UA: NEGATIVE
SPEC GRAV UA: 1.015 (ref 1.010–1.025)

## 2017-05-16 MED ORDER — SULFAMETHOXAZOLE-TRIMETHOPRIM 800-160 MG PO TABS: 1.0000 | ORAL_TABLET | Freq: Two times a day (BID) | ORAL | 0 refills | Status: DC

## 2017-05-16 NOTE — Progress Notes (Signed)
   Subjective:    Patient ID: [name] [name], female    DOB: September 09, 1965, 52 y.o.   MRN: [id number]  HPI  Patient states she has been stinging and burning,itching when urinating she has had some abd pressure pain for the last week. She has not taken anything for this. She also is having left ear pain. Patient very frustrated because she has frequent urinary symptoms urinary infections has seen urologist and unfortunately is just not gotten any cure to her underlying problems she does relate intermittent lower bladder pain and discomfort with urinating but she denies pain with her cycles denies vaginal bleeding or mucus from the vagina she is having some clear drainage she states she will be seeing her gynecologist next week she denies any high fever chills or sweats Review of Systems Negative for bloody stools hematuria fevers chills sweats flank pains nausea vomiting diarrhea or chest discomfort    Results for orders placed or performed in visit on 05/16/17  POCT urinalysis dipstick  Result Value Ref Range   Color, UA     Clarity, UA     Glucose, UA     Bilirubin, UA     Ketones, UA     Spec Grav, UA 1.015 1.010 - 1.025   Blood, UA negative    pH, UA 7.0 5.0 - 8.0   Protein, UA     Urobilinogen, UA  0.2 or 1.0 E.U./dL   Nitrite, UA     Leukocytes, UA Small (1+) (A) Negative   Appearance     Odor     . Objective:   Physical Exam Neck no abnormal masses no tracheal deviation lungs are clear no crackles heart is regular no murmurs abdomen is soft no guarding rebound or tenderness flank nontender extremities no edema urinalysis with some WBCs urine culture was sent       Assessment & Plan:  UTI Bactrim twice daily for 7 days If any rash stop medicine Urine culture Follow-up with urology Await the culture result May need testing for interstitial cystitis if culture negative Follow-up with gynecology next week

## 2017-05-18 LAB — URINE CULTURE

## 2017-05-22 ENCOUNTER — Ambulatory Visit (INDEPENDENT_AMBULATORY_CARE_PROVIDER_SITE_OTHER): Payer: Managed Care, Other (non HMO) | Admitting: Adult Health

## 2017-05-22 ENCOUNTER — Telehealth: Payer: Self-pay | Admitting: *Deleted

## 2017-05-22 ENCOUNTER — Other Ambulatory Visit (HOSPITAL_COMMUNITY)
Admission: RE | Admit: 2017-05-22 | Discharge: 2017-05-22 | Disposition: A | Discharge status: Home / Self Care | Payer: Managed Care, Other (non HMO) | Source: Ambulatory Visit | Attending: Adult Health | Admitting: Adult Health

## 2017-05-22 ENCOUNTER — Encounter: Payer: Self-pay | Admitting: Adult Health

## 2017-05-22 VITALS — BP 128/70 | HR 67 | Ht 60.5 in | Wt 128.0 lb

## 2017-05-22 DIAGNOSIS — Z1212 Encounter for screening for malignant neoplasm of rectum: Secondary | ICD-10-CM | POA: Diagnosis not present

## 2017-05-22 DIAGNOSIS — Z1211 Encounter for screening for malignant neoplasm of colon: Secondary | ICD-10-CM

## 2017-05-22 DIAGNOSIS — Z01419 Encounter for gynecological examination (general) (routine) without abnormal findings: Secondary | ICD-10-CM | POA: Diagnosis not present

## 2017-05-22 DIAGNOSIS — N3010 Interstitial cystitis (chronic) without hematuria: Secondary | ICD-10-CM

## 2017-05-22 DIAGNOSIS — Z3045 Encounter for surveillance of transdermal patch hormonal contraceptive device: Secondary | ICD-10-CM | POA: Diagnosis not present

## 2017-05-22 LAB — HEMOCCULT GUIAC POC 1CARD (OFFICE): Fecal Occult Blood, POC: NEGATIVE

## 2017-05-22 MED ORDER — FLUCONAZOLE 150 MG PO TABS: ORAL_TABLET | ORAL | 2 refills | Status: DC

## 2017-05-22 MED ORDER — URELLE 81 MG PO TABS: 1.0000 | ORAL_TABLET | Freq: Three times a day (TID) | ORAL | 1 refills | Status: DC

## 2017-05-22 MED ORDER — NORELGESTROMIN-ETH ESTRADIOL 150-35 MCG/24HR TD PTWK: MEDICATED_PATCH | TRANSDERMAL | 12 refills | Status: DC

## 2017-05-22 NOTE — Telephone Encounter (Signed)
I agree this patient should be evaluated by urology please help her set up with the urologist she was previously seen.  Concern for the possibility of interstitial cystitis.  Please put in referral.  Please have the reason for referral for possible interstitial cystitis.

## 2017-05-22 NOTE — Telephone Encounter (Signed)
scotts 

## 2017-05-22 NOTE — Telephone Encounter (Signed)
Referral put in. Pt notified.  

## 2017-05-22 NOTE — Progress Notes (Signed)
Patient ID: [name] [name], female   DOB: [birth year], 52 y.o.   MRN: [id number] History of Present Illness: [name] [name] is a 52 year old white female, engaged in for a well woman gyn exam and pap.Going to [place] soon, and is still working at [employer] in [place].  PCP is [facility].   Current Medications, Allergies, Past Medical History, Past Surgical History, Family History and Social History were reviewed in [employer] record.     Review of Systems: Patient denies any headaches, hearing loss, fatigue, blurred vision, shortness of breath, chest pain, abdominal pain, problems with bowel movements, or intercourse. No joint pain or mood swings. Has pressure and burning with urination at times. Is on antibiotics now, PCP is thinking could be IC.   Physical Exam:BP 128/70 (BP Location: Left Arm, Patient Position: Sitting, Cuff Size: Normal)   Pulse 67   Ht 5' 0.5" (1.537 m)   Wt 128 lb (58.1 kg)   LMP 05/01/2017 (Approximate)   BMI 24.59 kg/m  General:  Well developed, well nourished, no acute distress Skin:  Warm and dry Neck:  Midline trachea, normal thyroid, good ROM, no lymphadenopathy Lungs; Clear to auscultation bilaterally Breast:  No dominant palpable mass, retraction, or nipple discharge,bilateral inverted nipples Cardiovascular: Regular rate and rhythm Abdomen:  Soft, non tender, no hepatosplenomegaly Pelvic:  External genitalia is normal in appearance, no lesions.  The vagina is normal in appearance. Urethra has no lesions or masses. The cervix is bulbous. Pap with HPV performed. Uterus is felt to be normal size, shape, and contour.  No adnexal masses or tenderness noted.Bladder is non tender, no masses felt. Rectal: Good sphincter tone, no polyps, or hemorrhoids felt.  Hemoccult negative. Extremities/musculoskeletal:  No swelling or varicosities noted, no clubbing or cyanosis Psych:  No mood changes, alert and cooperative,seems happy PHQ 2 score 0.  Will try urelle to see if helps symptoms.  Impression: 1. Encounter for gynecological examination with Papanicolaou smear of cervix   2. Screening for colorectal cancer   3. Encounter for surveillance of transdermal patch hormonal contraceptive device       Plan: Meds ordered this encounter  Medications  . URELLE (URELLE/URISED) 81 MG TABS tablet    Sig: Take 1 tablet (81 mg total) by mouth 3 (three) times daily.    Dispense:  45 each    Refill:  1    Order Specific Question:   Supervising Provider    Answer:   Elonda Husky, LUTHER H [[id number]]  . norelgestromin-ethinyl estradiol Marilu Favre) 150-35 MCG/24HR transdermal patch    Sig: APPLY 1 PATCH AND REPLACE WEEKLY FOR 3 WEEKS    Dispense:  3 patch    Refill:  12    Order Specific Question:   Supervising Provider    Answer:   Elonda Husky, LUTHER H [[id number]]  . fluconazole (DIFLUCAN) 150 MG tablet    Sig: Take 1 now and repeat in 3 days if needed    Dispense:  2 tablet    Refill:  2    Order Specific Question:   Supervising Provider    Answer:   Florian Buff [[id number]]  Physical in 1 year Pap in 3 if normal Mammogram yearly Labs with PCP

## 2017-05-22 NOTE — Telephone Encounter (Signed)
Patient called stating Dr Wolfgang Phoenix told her to call if she is still having trouble with her bladder to call and he will get her into the urologist she seen before. Patient requested a referral.

## 2017-05-24 LAB — CYTOLOGY - PAP
ADEQUACY: ABSENT
DIAGNOSIS: NEGATIVE
HPV: NOT DETECTED

## 2017-05-29 ENCOUNTER — Telehealth: Payer: Self-pay | Admitting: *Deleted

## 2017-05-29 MED ORDER — IBUPROFEN 800 MG PO TABS: 800.0000 mg | ORAL_TABLET | Freq: Three times a day (TID) | ORAL | 1 refills | Status: DC | PRN

## 2017-05-29 NOTE — Telephone Encounter (Signed)
Left message that rx sent

## 2017-05-31 ENCOUNTER — Encounter: Payer: Self-pay | Admitting: Family Medicine

## 2017-07-09 ENCOUNTER — Ambulatory Visit: Payer: Managed Care, Other (non HMO) | Admitting: Neurology

## 2017-07-26 ENCOUNTER — Other Ambulatory Visit: Payer: Self-pay | Admitting: Adult Health

## 2017-08-29 ENCOUNTER — Encounter (HOSPITAL_COMMUNITY): Payer: Self-pay | Admitting: Emergency Medicine

## 2017-08-29 ENCOUNTER — Other Ambulatory Visit: Payer: Self-pay

## 2017-08-29 ENCOUNTER — Emergency Department (HOSPITAL_COMMUNITY)
Admission: EM | Admit: 2017-08-29 | Discharge: 2017-08-29 | Disposition: A | Discharge status: Home / Self Care | Payer: Self-pay | Attending: Emergency Medicine | Admitting: Emergency Medicine

## 2017-08-29 ENCOUNTER — Emergency Department (HOSPITAL_COMMUNITY): Payer: Self-pay

## 2017-08-29 ENCOUNTER — Telehealth: Payer: Self-pay

## 2017-08-29 DIAGNOSIS — Z79899 Other long term (current) drug therapy: Secondary | ICD-10-CM | POA: Insufficient documentation

## 2017-08-29 DIAGNOSIS — Z87891 Personal history of nicotine dependence: Secondary | ICD-10-CM | POA: Insufficient documentation

## 2017-08-29 DIAGNOSIS — Y990 Civilian activity done for income or pay: Secondary | ICD-10-CM | POA: Insufficient documentation

## 2017-08-29 DIAGNOSIS — Y939 Activity, unspecified: Secondary | ICD-10-CM | POA: Insufficient documentation

## 2017-08-29 DIAGNOSIS — Y33XXXA Other specified events, undetermined intent, initial encounter: Secondary | ICD-10-CM | POA: Insufficient documentation

## 2017-08-29 DIAGNOSIS — Z7982 Long term (current) use of aspirin: Secondary | ICD-10-CM | POA: Insufficient documentation

## 2017-08-29 DIAGNOSIS — M5412 Radiculopathy, cervical region: Secondary | ICD-10-CM | POA: Insufficient documentation

## 2017-08-29 DIAGNOSIS — Y92512 Supermarket, store or market as the place of occurrence of the external cause: Secondary | ICD-10-CM | POA: Insufficient documentation

## 2017-08-29 DIAGNOSIS — S161XXA Strain of muscle, fascia and tendon at neck level, initial encounter: Secondary | ICD-10-CM | POA: Insufficient documentation

## 2017-08-29 MED ORDER — CYCLOBENZAPRINE HCL 5 MG PO TABS: 5.0000 mg | ORAL_TABLET | Freq: Three times a day (TID) | ORAL | 0 refills | Status: DC | PRN

## 2017-08-29 MED ORDER — PREDNISONE 10 MG PO TABS
60.0000 mg | ORAL_TABLET | Freq: Once | ORAL | Status: AC
  Administered 2017-08-29: 60 mg via ORAL
  Filled 2017-08-29: qty 1

## 2017-08-29 MED ORDER — PREDNISONE 10 MG PO TABS: ORAL_TABLET | ORAL | 0 refills | Status: DC

## 2017-08-29 NOTE — ED Triage Notes (Signed)
Pt c/o left side neck pain which has happened before but states radiates down left arm today. Took otc med today with no relief. States does lift a lot at work.

## 2017-08-29 NOTE — Telephone Encounter (Signed)
Patient called at 2 pm today stating she has had a sharp pain running down the left side of her neck and down the arm and shoulder on that side. She took an Asprin this am around 9 am this am and it is continuing. I advised her to go to the Ed and be evaluated as this could be something serious. She states understanding and will take our advise and go to the ed.

## 2017-08-29 NOTE — ED Provider Notes (Signed)
[facility] EMERGENCY DEPARTMENT Provider Note   CSN: [id number] Arrival date & time: 08/29/17  1418     History   Chief Complaint Chief Complaint  Patient presents with  . Neck Pain    HPI [name] [name] is a 52 y.o. female with past medical history as outlined below including occasional headaches and known lumbar degenerative disc disease presenting with neck pain which is acute on intermittent chronic pain.  She works for a [employer] and reports frequent heavy lifting including overhead work.  Today at work she developed sudden onset of a left-sided neck pain which radiates into her left shoulder blade and also tingling that radiated down her left arm into her ring and fifth fingers, although this symptom is currently resolved.  Her pain is constant described as sharp, is worsened with certain positions, especially stretching her left shoulder.  She denies weakness or numbness in her arms.  She denies headache, dizziness, chest pain, shortness of breath.  She has taken ibuprofen prior to arrival with no relief of symptoms.  The history is provided by the patient.  Neck Pain   Associated symptoms include numbness. Pertinent negatives include no chest pain, no headaches and no weakness.    Past Medical History:  Diagnosis Date  . Contraceptive management 11/13/2012  . Frequent UTI    after sex  . Hemorrhoids 04/02/2015  . Pain    back  . Rectal fissure 03/30/2014  . Vertigo     Patient Active Problem List   Diagnosis Date Noted  . Encounter for gynecological examination with Papanicolaou smear of cervix 05/22/2017  . Screening for colorectal cancer 05/22/2017  . Encounter for surveillance of transdermal patch hormonal contraceptive device 05/16/2016  . Well woman exam with routine gynecological exam 05/16/2016  . Hemorrhoids 04/02/2015  . Rectal fissure 03/30/2014  . Contraceptive management 11/13/2012    Past Surgical History:  Procedure Laterality Date  .  CESAREAN SECTION       OB History    Gravida  1   Para  1   Term      Preterm      AB      Living  1     SAB      TAB      Ectopic      Multiple      Live Births  1            Home Medications    Prior to Admission medications   Medication Sig Start Date End Date Taking? Authorizing Provider  ALPRAZolam Duanne Moron) 0.5 MG tablet TAKE 1/2 TO 1 TABLET BY MOUTH TWICE A DAY IF NEEDED 04/24/17   Kathyrn Drown, MD  aspirin EC 81 MG tablet Take 81 mg by mouth daily.    [provider]  cyclobenzaprine (FLEXERIL) 5 MG tablet Take 1 tablet (5 mg total) by mouth 3 (three) times daily as needed for muscle spasms. 08/29/17   Evalee Jefferson, PA-C  diazepam (VALIUM) 2 MG tablet Take 1 every 8 hours as needed for vertigo 04/02/15   Estill Dooms, NP  fluconazole (DIFLUCAN) 150 MG tablet Take 1 now and repeat in 3 days if needed 05/22/17   Estill Dooms, NP  ibuprofen (ADVIL,MOTRIN) 800 MG tablet TAKE ONE TABLET BY MOUTH EVERY 8 HOURS AS NEEDED 07/26/17   Estill Dooms, NP  nitrofurantoin, macrocrystal-monohydrate, (MACROBID) 100 MG capsule Take 1 after sex 04/16/17   Estill Dooms, NP  norelgestromin-ethinyl  estradiol Marilu Favre) 150-35 MCG/24HR transdermal patch APPLY 1 PATCH AND REPLACE WEEKLY FOR 3 WEEKS 05/22/17   Estill Dooms, NP  omeprazole (PRILOSEC) 20 MG capsule Take 20 mg by mouth as needed.     [provider]  predniSONE (DELTASONE) 10 MG tablet Take 6 tablets day one, 5 tablets day two, 4 tablets day three, 3 tablets day four, 2 tablets day five, then 1 tablet day six 08/29/17   Zariah Jost, Almyra Free, PA-C  sulfamethoxazole-trimethoprim (BACTRIM DS,SEPTRA DS) 800-160 MG tablet Take 1 tablet by mouth 2 (two) times daily. 05/16/17   Luking, Elayne Snare, MD  URELLE (URELLE/URISED) 81 MG TABS tablet Take 1 tablet (81 mg total) by mouth 3 (three) times daily. 05/22/17   Estill Dooms, NP  valACYclovir (VALTREX) 1000 MG tablet 2 po bid Patient taking  differently: 2 po bid prn 01/14/16   Kathyrn Drown, MD    Family History Family History  Problem Relation Age of Onset  . Diabetes Mother   . Hypertension Mother   . Diabetes Father   . Heart disease Father   . Stroke Father   . Kidney failure Father   . Cirrhosis Father   . Hypertension Brother   . Cancer Maternal Aunt        colon,liver  . Cancer Maternal Uncle        lung  . Heart disease Paternal Grandfather   . Alzheimer's disease Paternal Grandmother     Social History Social History   Tobacco Use  . Smoking status: Former Smoker    Types: Cigarettes    Start date: 10/01/1987    Last attempt to quit: 10/01/1994    Years since quitting: 22.9  . Smokeless tobacco: Never Used  Substance Use Topics  . Alcohol use: Yes    Comment: occ  . Drug use: No     Allergies   Codeine; Keflex [cephalexin]; Penicillins; and Levaquin [levofloxacin in d5w]   Review of Systems Review of Systems  Constitutional: Negative for fever.  Respiratory: Negative for cough.   Cardiovascular: Negative for chest pain.  Musculoskeletal: Positive for arthralgias and neck pain. Negative for joint swelling and myalgias.  Neurological: Positive for numbness. Negative for weakness and headaches.     Physical Exam Updated Vital Signs BP 133/63 (BP Location: Right Arm)   Pulse 66   Temp 98.5 F (36.9 C) (Temporal)   Resp 15   Ht 5\' 1"  (1.549 m)   Wt 58.1 kg   LMP 08/20/2017   SpO2 100%   BMI 24.19 kg/m   Physical Exam  Constitutional: She is oriented to person, place, and time. She appears well-developed and well-nourished.  HENT:  Head: Atraumatic.  Neck: Normal range of motion.  Cardiovascular:  Pulses equal bilaterally  Musculoskeletal: She exhibits tenderness. She exhibits no edema or deformity.  Neurological: She is alert and oriented to person, place, and time. She has normal strength. She displays normal reflexes. No cranial nerve deficit or sensory deficit. She  exhibits normal muscle tone.  Reflex Scores:      Bicep reflexes are 2+ on the right side and 2+ on the left side. Equal grip strength  Skin: Skin is warm and dry.  Psychiatric: She has a normal mood and affect.     ED Treatments / Results  Labs (all labs ordered are listed, but only abnormal results are displayed) Labs Reviewed - No data to display  EKG None  Radiology Ct Cervical Spine Wo Contrast  Result Date:  08/29/2017 CLINICAL DATA:  Left-sided neck pain radiating to the shoulder EXAM: CT CERVICAL SPINE WITHOUT CONTRAST TECHNIQUE: Multidetector CT imaging of the cervical spine was performed without intravenous contrast. Multiplanar CT image reconstructions were also generated. COMPARISON:  None. FINDINGS: Alignment: There is mild reversal of normal cervical lordosis. No static subluxation. Skull base and vertebrae: No acute fracture. No primary bone lesion or focal pathologic process. Soft tissues and spinal canal: No prevertebral fluid or swelling. No visible canal hematoma. Disc levels: There is moderate disc space narrowing at C5-C6 and C6-C7. There is no spinal canal stenosis at these levels. At both levels, there is mild-to-moderate left foraminal stenosis due to uncovertebral hypertrophy. Upper chest: Clear Other: None IMPRESSION: Mild-to-moderate left neural foraminal stenosis at C5-6 and C6-7, primarily caused by uncovertebral hypertrophy. Electronically Signed   By: Ulyses Jarred M.D.   On: 08/29/2017 15:33    Procedures Procedures (including critical care time)  Medications Ordered in ED Medications  predniSONE (DELTASONE) tablet 60 mg (60 mg Oral Given 08/29/17 1614)     Initial Impression / Assessment and Plan / ED Course  I have reviewed the triage vital signs and the nursing notes.  Pertinent labs & imaging results that were available during my care of the patient were reviewed by me and considered in my medical decision making (see chart for details).     CT  imaging was performed given patient's description of radicular pain.  There is evidence of cervical involvement at the C5-C7 level with degenerative disc disease at these levels.  She was placed on prednisone for inflammation, Flexeril also prescribed.  We discussed heat therapy and activity as tolerated.  Plan follow-up with her PCP for recheck in 1 week if symptoms are not improving or for any worsening pain.  She has no exam findings to suggest TIA or CVA, there was no weakness during today's event, her symptoms and triggers appear musculoskeletal in nature.  Final Clinical Impressions(s) / ED Diagnoses   Final diagnoses:  Acute strain of neck muscle, initial encounter  Cervical radiculopathy    ED Discharge Orders         Ordered    cyclobenzaprine (FLEXERIL) 5 MG tablet  3 times daily PRN,   Status:  Discontinued     08/29/17 1600    predniSONE (DELTASONE) 10 MG tablet     08/29/17 1600    cyclobenzaprine (FLEXERIL) 5 MG tablet  3 times daily PRN     08/29/17 1618           Evalee Jefferson, PA-C 08/29/17 2114    Mesner, Corene Cornea, MD 08/30/17 [phone]

## 2017-08-29 NOTE — Discharge Instructions (Addendum)
You do have some degenerative changes in the C5 and C6 disks in your cervical spine which I suspect is causing the symptoms in your left arm and into your back.  Apply heat to your neck 20 minutes several times daily along with taking the medicines prescribed.  See your doctor for a recheck if not improving over the next week.

## 2017-10-11 ENCOUNTER — Telehealth: Payer: Self-pay | Admitting: Adult Health

## 2017-10-11 NOTE — Telephone Encounter (Signed)
Patient called stating that she would like [name] [name] to call her in a refill of her patches. Pt states that she knows where to send it to because she has not insurance. Please contact pt when done

## 2017-10-11 NOTE — Telephone Encounter (Signed)
Will give 5 patches of xulane

## 2017-11-28 ENCOUNTER — Other Ambulatory Visit: Payer: Self-pay | Admitting: Advanced Practice Midwife

## 2017-11-28 ENCOUNTER — Telehealth: Payer: Self-pay | Admitting: Adult Health

## 2017-11-28 NOTE — Telephone Encounter (Signed)
Patient called stating that she would like to know if we have samples of the norelgestromin-ethinyl estradiol Marilu Favre) 150-35 MCG/24HR transdermal patch. Pt states that if we don't can we call her in a refill to [facility] because she does not have insurance. I let patient know that Anderson Malta is not in  the office, pt would like to know if another provider could do this for her. Please contact pt

## 2017-11-28 NOTE — Telephone Encounter (Signed)
Pt requesting refill on xulane. No samples available.

## 2017-11-28 NOTE — Telephone Encounter (Signed)
Refilled to [facility] 

## 2017-12-03 ENCOUNTER — Telehealth: Payer: Self-pay | Admitting: Adult Health

## 2017-12-03 NOTE — Telephone Encounter (Signed)
Spoke with pt. Pt needs refill on Xulane patch. According to chart, prescription was sent to [facility]. I advised pt to have Kristopher Oppenheim transfer prescription to [facility] in [place]. Advised to call with any problems. Pt voiced understanding. [place]

## 2017-12-03 NOTE — Telephone Encounter (Signed)
Patient called, stated that she requested a refill on Xulane on 11/28/17.  Notes show we sent it, but [facility] in [place] is telling the patient that they never got it.  She needs this sent in.  [phone]

## 2018-01-23 ENCOUNTER — Encounter: Payer: Self-pay | Admitting: Adult Health

## 2018-01-23 ENCOUNTER — Ambulatory Visit: Payer: Managed Care, Other (non HMO) | Admitting: Adult Health

## 2018-01-23 VITALS — BP 116/74 | HR 69 | Ht 61.0 in | Wt 127.8 lb

## 2018-01-23 DIAGNOSIS — N941 Unspecified dyspareunia: Secondary | ICD-10-CM | POA: Diagnosis not present

## 2018-01-23 DIAGNOSIS — N9089 Other specified noninflammatory disorders of vulva and perineum: Secondary | ICD-10-CM

## 2018-01-23 DIAGNOSIS — R232 Flushing: Secondary | ICD-10-CM | POA: Diagnosis not present

## 2018-01-23 DIAGNOSIS — B009 Herpesviral infection, unspecified: Secondary | ICD-10-CM | POA: Diagnosis not present

## 2018-01-23 MED ORDER — VALACYCLOVIR HCL 1 G PO TABS: ORAL_TABLET | ORAL | 1 refills | Status: DC

## 2018-01-23 NOTE — Progress Notes (Signed)
Patient ID: [name] [name], female   DOB: [birth year], 53 y.o.   MRN: [id number] History of Present Illness:  [name] [name] is a 53 year old white female, divorced in complaining of bump in vagina that is painful. Still working at [name].  PCP is [name] [name].   Current Medications, Allergies, Past Medical History, Past Surgical History, Family History and Social History were reviewed in [name] record.     Review of Systems: Has bump in vagina area on right for 2 weeks, and is now sore She has history of cold sores +hot flashes, has been off patch since October, due to insurance issues, had period in November, has patches now, but has not put on yet +pain with sex, and decreased stimulation(no new [name])    Physical Exam:BP 116/74 (BP Location: Left Arm, Patient Position: Sitting, Cuff Size: Normal)   Pulse 69   Ht 5\' 1"  (1.549 m)   Wt 127 lb 12.8 oz (58 kg)   LMP 12/03/2017 (Approximate)   BMI 24.15 kg/m   General:  Well developed, well nourished, no acute distress Skin:  Warm and dry Pelvic:  External genitalia is normal in appearance, has vesicle area right inner labia red, and tender, HSV culture obtained, and has sebaceous cyst right labia, long standing. Psych:  No mood changes, alert and cooperative,seems happy Fall risk is low. Examination chaperoned by amanda Rash LPN.  Impression: 1. Labial irritation   2. Herpes   3. Hot flashes   4. Dyspareunia, female       Plan: Meds ordered this encounter  Medications  . valACYclovir (VALTREX) 1000 MG tablet    Sig: Take 1 bid for 10 days    Dispense:  20 tablet    Refill:  1    Order Specific Question:   Supervising Provider    Answer:   Tania Ade H [[id number]]  HSV culture sent Check [name] [name], will talk when results back, if not PM will get her to put patch on F/U in 2 weeks

## 2018-01-24 ENCOUNTER — Telehealth: Payer: Self-pay | Admitting: Adult Health

## 2018-01-24 LAB — FOLLICLE STIMULATING HORMONE: FSH: 102.6 m[IU]/mL

## 2018-01-24 NOTE — Telephone Encounter (Signed)
Left message to call about labs 

## 2018-01-25 ENCOUNTER — Telehealth: Payer: Self-pay | Admitting: Adult Health

## 2018-01-25 NOTE — Telephone Encounter (Signed)
Patient called, checking on lab results.  [phone]

## 2018-01-25 NOTE — Telephone Encounter (Signed)
Left message that [facility] is high, do not need birth control patch but if wants HRT can start that, call me back or can discuss further at appt 1/22, and culture not back yet

## 2018-01-26 LAB — HERPES SIMPLEX VIRUS CULTURE

## 2018-01-28 ENCOUNTER — Telehealth: Payer: Self-pay | Admitting: Adult Health

## 2018-01-28 NOTE — Telephone Encounter (Signed)
Pt aware that Culture negative, and area has resolved, has appt 1/22, will discuss HRT in more detail then

## 2018-01-30 ENCOUNTER — Ambulatory Visit: Payer: Managed Care, Other (non HMO) | Admitting: Adult Health

## 2018-02-06 ENCOUNTER — Ambulatory Visit: Payer: Managed Care, Other (non HMO) | Admitting: Adult Health

## 2018-02-06 ENCOUNTER — Encounter: Payer: Self-pay | Admitting: Adult Health

## 2018-02-06 ENCOUNTER — Ambulatory Visit (INDEPENDENT_AMBULATORY_CARE_PROVIDER_SITE_OTHER): Payer: Managed Care, Other (non HMO) | Admitting: Adult Health

## 2018-02-06 VITALS — BP 116/73 | HR 65 | Ht 61.0 in | Wt 128.0 lb

## 2018-02-06 DIAGNOSIS — R232 Flushing: Secondary | ICD-10-CM | POA: Diagnosis not present

## 2018-02-06 DIAGNOSIS — Z79890 Hormone replacement therapy (postmenopausal): Secondary | ICD-10-CM | POA: Diagnosis not present

## 2018-02-06 DIAGNOSIS — N9089 Other specified noninflammatory disorders of vulva and perineum: Secondary | ICD-10-CM | POA: Diagnosis not present

## 2018-02-06 MED ORDER — CONJ ESTROGENS-BAZEDOXIFENE 0.45-20 MG PO TABS: 1.0000 | ORAL_TABLET | Freq: Every day | ORAL | 12 refills | Status: DC

## 2018-02-06 NOTE — Progress Notes (Addendum)
Patient ID: [name] [name], female   DOB: [birth year], 53 y.o.   MRN: [id number] History of Present Illness:  [name] [name] is a 53 year old white female back in follow up on painful bump on right labia, and it has resolved, maybe a little itchy feeling at times.Herpes culture was negative, and FSH was 102.6.  Current Medications, Allergies, Past Medical History, Past Surgical History, Family History and Social History were reviewed in [employer] record.     Review of Systems: +hot flashes Still itchy on labia, but area has resolved.    Physical Exam:BP 116/73 (BP Location: Left Arm, Patient Position: Sitting, Cuff Size: Normal)   Pulse 65   Ht 5\' 1"  (1.549 m)   Wt 128 lb (58.1 kg)   BMI 24.19 kg/m  General:  Well developed, well nourished, no acute distress Skin:  Warm and dry Pelvic:  External genitalia is normal in appearance, no lesions.   Psych:  No mood changes, alert and cooperative,seems happy Examination chaperoned by Levy Pupa LPN. Discussed HRT risks and benefits, and  will try [facility].   Impression:  1. Hot flashes   2. Hormone replacement therapy (HRT)   3. Labial irritation      Plan: Meds ordered this encounter  Medications  . Conj Estrogens-Bazedoxifene (DUAVEE) 0.45-20 MG TABS    Sig: Take 1 tablet by mouth daily.    Dispense:  30 tablet    Refill:  12    Order Specific Question:   Supervising Provider    Answer:   Florian Buff [[id number]]  Discount card and handout on [facility] given  Follow up in May for physical, or sooner if needed

## 2018-03-20 ENCOUNTER — Encounter: Payer: Self-pay | Admitting: Family Medicine

## 2018-03-20 ENCOUNTER — Ambulatory Visit: Payer: Managed Care, Other (non HMO) | Admitting: Family Medicine

## 2018-03-20 VITALS — BP 126/88 | Temp 97.9°F | Ht 61.0 in | Wt <= 1120 oz

## 2018-03-20 DIAGNOSIS — K5900 Constipation, unspecified: Secondary | ICD-10-CM | POA: Diagnosis not present

## 2018-03-20 DIAGNOSIS — K921 Melena: Secondary | ICD-10-CM | POA: Diagnosis not present

## 2018-03-20 NOTE — Progress Notes (Signed)
   Subjective:    Patient ID: [name] [name], female    DOB: August 24, 1965, 53 y.o.   MRN: [id number]  HPI  Patient is here today due to rectal bleeding.She states she noticed this for a couple weeks now.  She states she notices when having a bowel movements. She reports it started out as a little bright red blood when she wipes and yesterday it was a lot.  She states he stools are hard and has not noticed and black stools. She denies family history of colon cancer except for her aunt who had colon cancer she had a colonoscopy over 10 years ago. No fevers, no other complaints.  Review of Systems  Constitutional: Negative for activity change, appetite change and fatigue.  HENT: Negative for congestion and rhinorrhea.   Respiratory: Negative for cough and shortness of breath.   Cardiovascular: Negative for chest pain and leg swelling.  Gastrointestinal: Positive for blood in stool and constipation. Negative for abdominal pain and diarrhea.  Endocrine: Negative for polydipsia and polyphagia.  Skin: Negative for color change.  Neurological: Negative for dizziness and weakness.  Psychiatric/Behavioral: Negative for behavioral problems and confusion.       Objective:   Physical Exam Vitals signs reviewed.  Constitutional:      General: She is not in acute distress. HENT:     Head: Normocephalic and atraumatic.  Eyes:     General:        Right eye: No discharge.        Left eye: No discharge.  Neck:     Trachea: No tracheal deviation.  Cardiovascular:     Rate and Rhythm: Normal rate and regular rhythm.     Heart sounds: Normal heart sounds. No murmur.  Pulmonary:     Effort: Pulmonary effort is normal. No respiratory distress.     Breath sounds: Normal breath sounds.  Lymphadenopathy:     Cervical: No cervical adenopathy.  Skin:    General: Skin is warm and dry.  Neurological:     Mental Status: She is alert.     Coordination: Coordination normal.  Psychiatric:      Behavior: Behavior normal.     Abdomen is soft no guarding or rebound no masses felt patient constipated with hard stool on rectal exam no mass felt Hemoccult was negative Nurse was present Patient states that she would prefer Dr.Rehman    Assessment & Plan:  Blood in stool Constipation Please see patient instructions Referral MiraLAX along with Colace recommended If progressive troubles or worse to follow-up Referral is in place next step is colonoscopy Patient to do lab work To report to [place] if progressive troubles

## 2018-03-20 NOTE — Patient Instructions (Signed)
Daziah Primarily I feel that your problem is due to the constipation. As we discussed you will need a colonoscopy. We did go ahead with a referral to Dr.Rehman-you should hear from their office within the next 7 to 10 days regarding an office visit if not please call us.  Certainly if your problem gets worse notify us. Please do your lab work.  As for the constipation I recommend that you drink primarily water.  It would be wise for you to take Colace 1 tablet daily as a stool softener.  I also recommend MiraLAX-it is okay to use the store brand- 1 capful of MiraLAX into 8 ounces of water, drink daily.  The goal is to promote soft bowel movements.  It may take a few days to see that result.  If this does not improve your bowel movement situation by Monday please let us know.  If it causes your stools to be too loose I recommend reducing the MiraLAX to a half a capful daily.  Please feel free to keep [place] updated on how you are doing.  We will let you know the results of your blood tests.

## 2018-03-21 LAB — CBC WITH DIFFERENTIAL/PLATELET
Basophils Absolute: 0 10*3/uL (ref 0.0–0.2)
Basos: 1 %
EOS (ABSOLUTE): 0.1 10*3/uL (ref 0.0–0.4)
Eos: 3 %
Hematocrit: 38.2 % (ref 34.0–46.6)
Hemoglobin: 13 g/dL (ref 11.1–15.9)
Immature Grans (Abs): 0 10*3/uL (ref 0.0–0.1)
Immature Granulocytes: 0 %
LYMPHS ABS: 1.4 10*3/uL (ref 0.7–3.1)
Lymphs: 30 %
MCH: 30.3 pg (ref 26.6–33.0)
MCHC: 34 g/dL (ref 31.5–35.7)
MCV: 89 fL (ref 79–97)
Monocytes Absolute: 0.5 10*3/uL (ref 0.1–0.9)
Monocytes: 12 %
Neutrophils Absolute: 2.4 10*3/uL (ref 1.4–7.0)
Neutrophils: 54 %
Platelets: 225 10*3/uL (ref 150–450)
RBC: 4.29 x10E6/uL (ref 3.77–5.28)
RDW: 13.1 % (ref 11.7–15.4)
WBC: 4.5 10*3/uL (ref 3.4–10.8)

## 2018-03-22 ENCOUNTER — Encounter (INDEPENDENT_AMBULATORY_CARE_PROVIDER_SITE_OTHER): Payer: Self-pay | Admitting: Internal Medicine

## 2018-03-25 ENCOUNTER — Ambulatory Visit (INDEPENDENT_AMBULATORY_CARE_PROVIDER_SITE_OTHER): Payer: Managed Care, Other (non HMO) | Admitting: Internal Medicine

## 2018-03-25 ENCOUNTER — Encounter (INDEPENDENT_AMBULATORY_CARE_PROVIDER_SITE_OTHER): Payer: Self-pay | Admitting: Internal Medicine

## 2018-03-25 ENCOUNTER — Other Ambulatory Visit (INDEPENDENT_AMBULATORY_CARE_PROVIDER_SITE_OTHER): Payer: Self-pay | Admitting: *Deleted

## 2018-03-25 ENCOUNTER — Encounter (INDEPENDENT_AMBULATORY_CARE_PROVIDER_SITE_OTHER): Payer: Self-pay | Admitting: *Deleted

## 2018-03-25 VITALS — BP 118/79 | HR 74 | Temp 98.0°F | Resp 18 | Ht 61.0 in | Wt 125.4 lb

## 2018-03-25 DIAGNOSIS — K625 Hemorrhage of anus and rectum: Secondary | ICD-10-CM | POA: Diagnosis not present

## 2018-03-25 DIAGNOSIS — K5900 Constipation, unspecified: Secondary | ICD-10-CM | POA: Diagnosis not present

## 2018-03-25 DIAGNOSIS — K5909 Other constipation: Secondary | ICD-10-CM | POA: Insufficient documentation

## 2018-03-25 MED ORDER — FAMOTIDINE 20 MG PO TABS: 20.0000 mg | ORAL_TABLET | Freq: Every day | ORAL | Status: DC | PRN

## 2018-03-25 MED ORDER — NAPROXEN SODIUM 220 MG PO TABS: 220.0000 mg | ORAL_TABLET | Freq: Every day | ORAL | Status: DC | PRN

## 2018-03-25 MED ORDER — DOCUSATE SODIUM 100 MG PO CAPS: 200.0000 mg | ORAL_CAPSULE | Freq: Every day | ORAL | 0 refills | Status: DC

## 2018-03-25 NOTE — Progress Notes (Signed)
Presenting complaint;  Rectal bleeding and constipation.  History of present illness:  Patient is 53 year old Caucasian female who is referred through courtesy of Dr. Sallee Lange for GI evaluation. Patient reports onset of constipation about a year ago but is gotten worse over the last 6 months.  She also has been experiencing rectal bleeding usually with her bowel movements.  She would generally notice small amount of blood with the bowel movements but last Tuesday she noted large amount of blood.  She was seen by Dr. Wolfgang Phoenix who recommended stool softener.  She is taking 100 mg daily and she is already feeling better.  She says her baseline before she became constipated was 2 bowel movements per day.  Now she has no more than 3-4 bowel movements per week.  She rates her stool to be 2 on [place] stool chart.  She denies abdominal pain nausea or vomiting.  Her appetite is good.  Her weight ranges between 123 and 127 pounds.  She also reports bloating. She had colonoscopy back in April 2008 for rectal bleeding and pain.  Colonoscopy was unremarkable.  It was felt she could have had anal fissure prior to that exam. She has intermittent heartburn for which she uses OTC omeprazole which is not listed. She is active.  She has been going to [employer] couple of times a week but she has slacked off this year.  She is hoping to get back on her routine soon.   Current Medications: Outpatient Encounter Medications as of 03/25/2018  Medication Sig  . ALPRAZolam (XANAX) 0.5 MG tablet TAKE 1/2 TO 1 TABLET BY MOUTH TWICE A DAY IF NEEDED  . aspirin EC 81 MG tablet Take 81 mg by mouth daily.  Marland Kitchen ibuprofen (ADVIL,MOTRIN) 800 MG tablet TAKE ONE TABLET BY MOUTH EVERY 8 HOURS AS NEEDED  . [DISCONTINUED] Conj Estrogens-Bazedoxifene (DUAVEE) 0.45-20 MG TABS Take 1 tablet by mouth daily. (Patient not taking: Reported on 03/25/2018)   No facility-administered encounter medications on file as of 03/25/2018.    Past  medical history:  GERD. Anxiety. Normal colonoscopy in April 2008.  Allergies: Allergies  Allergen Reactions  . Codeine Nausea And Vomiting  . Keflex [Cephalexin] Swelling  . Penicillins Nausea And Vomiting    Has patient had a PCN reaction causing immediate rash, facial/tongue/throat swelling, SOB or lightheadedness with hypotension: No Has patient had a PCN reaction causing severe rash involving mucus membranes or skin necrosis: No Has patient had a PCN reaction that required hospitalization No Has patient had a PCN reaction occurring within the last 10 years: No If all of the above answers are "NO", then may proceed with Cephalosporin use.   Mack Hook [Levofloxacin In D5w] Swelling and Rash    Tongue, throat swelling, oral rash-patient has taken Cipro and tolerated this medication. Although the cause it is a similar medicine we try to avoid Cipro     Social history:  She is married and accompanied by her husband today. She has a daughter age 7 in good health. She smoked cigarettes for about 9 years less than a pack a day and quit in 1996.  She drinks alcohol occasionally. 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  Prior to that she was at [employer].  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  Family history:  Mother is 27 years old.  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 Father was diagnosed with cirrhosis when he  was 53 years old and died 2 years later. She has 1 brother age 97 with hypertension and GERD but overall doing well.   Physical examination: Blood pressure 118/79, pulse 74, temperature 98 F (36.7 C), temperature source Oral, resp. rate 18, height '5\' 1"'$  (1.549 m), weight 125 lb 6.4 oz (56.9 kg). Patient is alert and in no acute distress. Conjunctiva is pink. Sclera is nonicteric Oropharyngeal mucosa is normal. No neck masses or thyromegaly noted. Cardiac exam with regular rhythm normal S1 and S2.  No murmur or gallop noted. Lungs are clear to auscultation. Abdomen is symmetrical.  Bowel sounds are normal.  On palpation abdomen is soft and nontender.  Percussion note is somewhat more tympanitic on the right side compared to the left side. Rectal examination deferred. No LE edema or clubbing noted.  Labs/studies Results:  CBC Latest Ref Rng & Units 03/20/2018 04/23/2016 10/15/2015  WBC 3.4 - 10.8 x10E3/uL 4.5 7.0 6.2  Hemoglobin 11.1 - 15.9 g/dL 13.0 12.7 13.1  Hematocrit 34.0 - 46.6 % 38.2 38.5 39.4  Platelets 150 - 450 x10E3/uL 225 225 250    CMP Latest Ref Rng & Units 05/01/2017 04/23/2016 10/15/2015  Glucose 65 - 99 mg/dL 91 99 88  BUN 6 - 24 mg/dL 8 9 -  Creatinine 0.57 - 1.00 mg/dL 0.77 0.80 -  Sodium 134 - 144 mmol/L 137 138 -  Potassium 3.5 - 5.2 mmol/L 4.4 4.1 -  Chloride 96 - 106 mmol/L 103 105 -  CO2 20 - 29 mmol/L 23 25 -  Calcium 8.7 - 10.2 mg/dL 9.4 9.9 -  Total Protein 6.5 - 8.1 g/dL - 6.7 -  Total Bilirubin 0.3 - 1.2 mg/dL - 0.3 -  Alkaline Phos 38 - 126 U/L - 45 -  AST 15 - 41 U/L - 18 -  ALT 14 - 54 U/L - 12(L) -    Hepatic Function Latest Ref Rng & Units 04/23/2016 10/15/2015 11/17/2014  Total Protein 6.5 - 8.1 g/dL 6.7 6.9 6.5  Albumin 3.5 - 5.0 g/dL 3.3(L) 4.0 3.6  AST 15 - 41 U/L '18 13 20  '$ ALT 14 - 54 U/L 12(L) 12 13(L)  Alk Phosphatase 38 - 126 U/L 45 42 44  Total Bilirubin 0.3 - 1.2 mg/dL 0.3 0.5 0.6  Bilirubin, Direct 0.1 - 0.5 mg/dL 0.1 - 0.2      Assessment:  #1.  Rectal bleeding in the setting of chronic constipation what appeared to be due to hemorrhoids.  She could also have anal fissure but this is less likely as her defecation is not painful.  Other etiology is less likely.  She seemed to be doing better with Colace but she needs to increase the dose to 200 mg daily.  She also needs to consume fiber rich foods on daily basis.  #2.  Uncomplicated GERD.  She may consider using famotidine rather than PPI.   Recommendations:  Increase Colace to 200  mg by mouth daily. High-fiber diet.  Food choices discussed with patient. Diagnostic colonoscopy to be scheduled in near future. She will keep stool diary for the next 4 weeks and bring [place] a summary. Office visit on as-needed basis.

## 2018-03-25 NOTE — H&P (View-Only) (Signed)
Presenting complaint;  Rectal bleeding and constipation.  History of present illness:  Patient is 53 year old Caucasian female who is referred through courtesy of Dr. Sallee Lange for GI evaluation. Patient reports onset of constipation about a year ago but is gotten worse over the last 6 months.  She also has been experiencing rectal bleeding usually with her bowel movements.  She would generally notice small amount of blood with the bowel movements but last Tuesday she noted large amount of blood.  She was seen by Dr. Wolfgang Phoenix who recommended stool softener.  She is taking 100 mg daily and she is already feeling better.  She says her baseline before she became constipated was 2 bowel movements per day.  Now she has no more than 3-4 bowel movements per week.  She rates her stool to be 2 on [facility] stool chart.  She denies abdominal pain nausea or vomiting.  Her appetite is good.  Her weight ranges between 123 and 127 pounds.  She also reports bloating. She had colonoscopy back in April 2008 for rectal bleeding and pain.  Colonoscopy was unremarkable.  It was felt she could have had anal fissure prior to that exam. She has intermittent heartburn for which she uses OTC omeprazole which is not listed. She is active.  She has been going to [employer] couple of times a week but she has slacked off this year.  She is hoping to get back on her routine soon.   Current Medications: Outpatient Encounter Medications as of 03/25/2018  Medication Sig  . ALPRAZolam (XANAX) 0.5 MG tablet TAKE 1/2 TO 1 TABLET BY MOUTH TWICE A DAY IF NEEDED  . aspirin EC 81 MG tablet Take 81 mg by mouth daily.  Marland Kitchen ibuprofen (ADVIL,MOTRIN) 800 MG tablet TAKE ONE TABLET BY MOUTH EVERY 8 HOURS AS NEEDED  . [DISCONTINUED] Conj Estrogens-Bazedoxifene (DUAVEE) 0.45-20 MG TABS Take 1 tablet by mouth daily. (Patient not taking: Reported on 03/25/2018)   No facility-administered encounter medications on file as of 03/25/2018.    Past  medical history:  GERD. Anxiety. Normal colonoscopy in April 2008.  Allergies: Allergies  Allergen Reactions  . Codeine Nausea And Vomiting  . Keflex [Cephalexin] Swelling  . Penicillins Nausea And Vomiting    Has patient had a PCN reaction causing immediate rash, facial/tongue/throat swelling, SOB or lightheadedness with hypotension: No Has patient had a PCN reaction causing severe rash involving mucus membranes or skin necrosis: No Has patient had a PCN reaction that required hospitalization No Has patient had a PCN reaction occurring within the last 10 years: No If all of the above answers are "NO", then may proceed with Cephalosporin use.   Mack Hook [Levofloxacin In D5w] Swelling and Rash    Tongue, throat swelling, oral rash-patient has taken Cipro and tolerated this medication. Although the cause it is a similar medicine we try to avoid Cipro     Social history:  She is married and accompanied by her husband today. She has a daughter age 85 in good health. She smoked cigarettes for about 9 years less than a pack a day and quit in 1996.  She drinks alcohol occasionally. 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  Prior to that she was at [employer].  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  Family history:  Mother is 52 years old.  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 Father was diagnosed with cirrhosis when he  was 53 years old and died 2 years later. She has 1 brother age 71 with hypertension and GERD but overall doing well.   Physical examination: Blood pressure 118/79, pulse 74, temperature 98 F (36.7 C), temperature source Oral, resp. rate 18, height '5\' 1"'$  (1.549 m), weight 125 lb 6.4 oz (56.9 kg). Patient is alert and in no acute distress. Conjunctiva is pink. Sclera is nonicteric Oropharyngeal mucosa is normal. No neck masses or thyromegaly noted. Cardiac exam with regular rhythm normal S1 and S2.  No murmur or gallop noted. Lungs are clear to auscultation. Abdomen is symmetrical.  Bowel sounds are normal.  On palpation abdomen is soft and nontender.  Percussion note is somewhat more tympanitic on the right side compared to the left side. Rectal examination deferred. No LE edema or clubbing noted.  Labs/studies Results:  CBC Latest Ref Rng & Units 03/20/2018 04/23/2016 10/15/2015  WBC 3.4 - 10.8 x10E3/uL 4.5 7.0 6.2  Hemoglobin 11.1 - 15.9 g/dL 13.0 12.7 13.1  Hematocrit 34.0 - 46.6 % 38.2 38.5 39.4  Platelets 150 - 450 x10E3/uL 225 225 250    CMP Latest Ref Rng & Units 05/01/2017 04/23/2016 10/15/2015  Glucose 65 - 99 mg/dL 91 99 88  BUN 6 - 24 mg/dL 8 9 -  Creatinine 0.57 - 1.00 mg/dL 0.77 0.80 -  Sodium 134 - 144 mmol/L 137 138 -  Potassium 3.5 - 5.2 mmol/L 4.4 4.1 -  Chloride 96 - 106 mmol/L 103 105 -  CO2 20 - 29 mmol/L 23 25 -  Calcium 8.7 - 10.2 mg/dL 9.4 9.9 -  Total Protein 6.5 - 8.1 g/dL - 6.7 -  Total Bilirubin 0.3 - 1.2 mg/dL - 0.3 -  Alkaline Phos 38 - 126 U/L - 45 -  AST 15 - 41 U/L - 18 -  ALT 14 - 54 U/L - 12(L) -    Hepatic Function Latest Ref Rng & Units 04/23/2016 10/15/2015 11/17/2014  Total Protein 6.5 - 8.1 g/dL 6.7 6.9 6.5  Albumin 3.5 - 5.0 g/dL 3.3(L) 4.0 3.6  AST 15 - 41 U/L '18 13 20  '$ ALT 14 - 54 U/L 12(L) 12 13(L)  Alk Phosphatase 38 - 126 U/L 45 42 44  Total Bilirubin 0.3 - 1.2 mg/dL 0.3 0.5 0.6  Bilirubin, Direct 0.1 - 0.5 mg/dL 0.1 - 0.2      Assessment:  #1.  Rectal bleeding in the setting of chronic constipation what appeared to be due to hemorrhoids.  She could also have anal fissure but this is less likely as her defecation is not painful.  Other etiology is less likely.  She seemed to be doing better with Colace but she needs to increase the dose to 200 mg daily.  She also needs to consume fiber rich foods on daily basis.  #2.  Uncomplicated GERD.  She may consider using famotidine rather than PPI.   Recommendations:  Increase Colace to 200  mg by mouth daily. High-fiber diet.  Food choices discussed with patient. Diagnostic colonoscopy to be scheduled in near future. She will keep stool diary for the next 4 weeks and bring [place] a summary. Office visit on as-needed basis.

## 2018-03-25 NOTE — Patient Instructions (Addendum)
High-fiber diet. Stool diary as to frequency and consistency of stools until time of colonoscopy. Colonoscopy to be scheduled in 3 weeks or so.

## 2018-03-27 ENCOUNTER — Encounter (HOSPITAL_COMMUNITY): Payer: Self-pay

## 2018-03-27 ENCOUNTER — Ambulatory Visit (HOSPITAL_COMMUNITY)
Admission: RE | Admit: 2018-03-27 | Discharge: 2018-03-27 | Disposition: A | Discharge status: Home / Self Care | Payer: Managed Care, Other (non HMO) | Attending: Internal Medicine | Admitting: Internal Medicine

## 2018-03-27 ENCOUNTER — Other Ambulatory Visit: Payer: Self-pay

## 2018-03-27 ENCOUNTER — Encounter (HOSPITAL_COMMUNITY)
Admission: RE | Disposition: A | Discharge status: Home / Self Care | Payer: Self-pay | Source: Home / Self Care | Attending: Internal Medicine

## 2018-03-27 DIAGNOSIS — Z7982 Long term (current) use of aspirin: Secondary | ICD-10-CM | POA: Diagnosis not present

## 2018-03-27 DIAGNOSIS — Z87891 Personal history of nicotine dependence: Secondary | ICD-10-CM | POA: Diagnosis not present

## 2018-03-27 DIAGNOSIS — K644 Residual hemorrhoidal skin tags: Secondary | ICD-10-CM | POA: Diagnosis not present

## 2018-03-27 DIAGNOSIS — K219 Gastro-esophageal reflux disease without esophagitis: Secondary | ICD-10-CM | POA: Diagnosis not present

## 2018-03-27 DIAGNOSIS — Z885 Allergy status to narcotic agent status: Secondary | ICD-10-CM | POA: Insufficient documentation

## 2018-03-27 DIAGNOSIS — Z881 Allergy status to other antibiotic agents status: Secondary | ICD-10-CM | POA: Diagnosis not present

## 2018-03-27 DIAGNOSIS — D120 Benign neoplasm of cecum: Secondary | ICD-10-CM | POA: Insufficient documentation

## 2018-03-27 DIAGNOSIS — Z79899 Other long term (current) drug therapy: Secondary | ICD-10-CM | POA: Insufficient documentation

## 2018-03-27 DIAGNOSIS — K625 Hemorrhage of anus and rectum: Secondary | ICD-10-CM

## 2018-03-27 DIAGNOSIS — Z880 Allergy status to penicillin: Secondary | ICD-10-CM | POA: Diagnosis not present

## 2018-03-27 DIAGNOSIS — F419 Anxiety disorder, unspecified: Secondary | ICD-10-CM | POA: Insufficient documentation

## 2018-03-27 DIAGNOSIS — Z791 Long term (current) use of non-steroidal anti-inflammatories (NSAID): Secondary | ICD-10-CM | POA: Diagnosis not present

## 2018-03-27 DIAGNOSIS — K5730 Diverticulosis of large intestine without perforation or abscess without bleeding: Secondary | ICD-10-CM | POA: Insufficient documentation

## 2018-03-27 DIAGNOSIS — K5909 Other constipation: Secondary | ICD-10-CM | POA: Insufficient documentation

## 2018-03-27 HISTORY — PX: POLYPECTOMY: SHX5525

## 2018-03-27 HISTORY — PX: COLONOSCOPY: SHX5424

## 2018-03-27 SURGERY — COLONOSCOPY
Anesthesia: Moderate Sedation

## 2018-03-27 MED ORDER — MIDAZOLAM HCL 5 MG/5ML IJ SOLN
INTRAMUSCULAR | Status: DC | PRN
  Administered 2018-03-27: 1 mg via INTRAVENOUS
  Administered 2018-03-27 (×3): 2 mg via INTRAVENOUS
  Administered 2018-03-27: 1 mg via INTRAVENOUS
  Administered 2018-03-27: 2 mg via INTRAVENOUS

## 2018-03-27 MED ORDER — MEPERIDINE HCL 100 MG/ML IJ SOLN
INTRAMUSCULAR | Status: AC
  Filled 2018-03-27: qty 1

## 2018-03-27 MED ORDER — MIDAZOLAM HCL 5 MG/5ML IJ SOLN
INTRAMUSCULAR | Status: AC
  Filled 2018-03-27: qty 10

## 2018-03-27 MED ORDER — MEPERIDINE HCL 50 MG/ML IJ SOLN
INTRAMUSCULAR | Status: DC | PRN
  Administered 2018-03-27 (×2): 25 mg

## 2018-03-27 MED ORDER — SODIUM CHLORIDE 0.9 % IV SOLN
INTRAVENOUS | Status: DC
  Administered 2018-03-27: 13:00:00 via INTRAVENOUS

## 2018-03-27 NOTE — Discharge Instructions (Signed)
No aspirin or NSAIDs for 3 days. Resume other medications as before. High-fiber diet. No driving for 24 hours. Stool diary as to frequency and consistency of stools for the next 4 weeks and provide office with summary. Remember you cannot have an MRI until clip has passed Physician will call with biopsy results.   Colonoscopy, Adult, Care After This sheet gives you information about how to care for yourself after your procedure. Your doctor may also give you more specific instructions. If you have problems or questions, call your doctor. What can I expect after the procedure? After the procedure, it is common to have:  A small amount of blood in your poop for 24 hours.  Some gas.  Mild cramping or bloating in your belly. Follow these instructions at home: General instructions  For the first 24 hours after the procedure: ? Do not drive or use machinery. ? Do not sign important documents. ? Do not drink alcohol. ? Do your daily activities more slowly than normal. ? Eat foods that are soft and easy to digest.  Take over-the-counter or prescription medicines only as told by your doctor. To help cramping and bloating:   Try walking around.  Put heat on your belly (abdomen) as told by your doctor. Use a heat source that your doctor recommends, such as a moist heat pack or a heating pad. ? Put a towel between your skin and the heat source. ? Leave the heat on for 20-30 minutes. ? Remove the heat if your skin turns bright red. This is especially important if you cannot feel pain, heat, or cold. You can get burned. Eating and drinking   Drink enough fluid to keep your pee (urine) clear or pale yellow.  Return to your normal diet as told by your doctor. Avoid heavy or fried foods that are hard to digest.  Avoid drinking alcohol for as long as told by your doctor. Contact a doctor if:  You have blood in your poop (stool) 2-3 days after the procedure. Get help right away  if:  You have more than a small amount of blood in your poop.  You see large clumps of tissue (blood clots) in your poop.  Your belly is swollen.  You feel sick to your stomach (nauseous).  You throw up (vomit).  You have a fever.  You have belly pain that gets worse, and medicine does not help your pain. Summary  After the procedure, it is common to have a small amount of blood in your poop. You may also have mild cramping and bloating in your belly.  For the first 24 hours after the procedure, do not drive or use machinery, do not sign important documents, and do not drink alcohol.  Get help right away if you have a lot of blood in your poop, feel sick to your stomach, have a fever, or have more belly pain. This information is not intended to replace advice given to you by your health care provider. Make sure you discuss any questions you have with your health care provider. Document Released: 02/04/2010 Document Revised: 11/02/2016 Document Reviewed: 09/27/2015 Elsevier Interactive Patient Education  2019 [place].  Colon Polyps  Polyps are tissue growths inside the body. Polyps can grow in many places, including the large intestine (colon). A polyp may be a round bump or a mushroom-shaped growth. You could have one polyp or several. Most colon polyps are noncancerous (benign). However, some colon polyps can become cancerous over time. Finding  and removing the polyps early can help prevent this. What are the causes? The exact cause of colon polyps is not known. What increases the risk? You are more likely to develop this condition if you:  Have a family history of colon cancer or colon polyps.  Are older than 53 or older than 45 if you are African American.  Have inflammatory bowel disease, such as ulcerative colitis or Crohn's disease.  Have certain hereditary conditions, such as: ? Familial adenomatous polyposis. ? Lynch syndrome. ? Turcot  syndrome. ? Peutz-Jeghers syndrome.  Are overweight.  Smoke cigarettes.  Do not get enough exercise.  Drink too much alcohol.  Eat a diet that is high in fat and red meat and low in fiber.  Had childhood cancer that was treated with abdominal radiation. What are the signs or symptoms? Most polyps do not cause symptoms. If you have symptoms, they may include:  Blood coming from your rectum when having a bowel movement.  Blood in your stool. The stool may look dark red or black.  Abdominal pain.  A change in bowel habits, such as constipation or diarrhea. How is this diagnosed? This condition is diagnosed with a colonoscopy. This is a procedure in which a lighted, flexible scope is inserted into the anus and then passed into the colon to examine the area. Polyps are sometimes found when a colonoscopy is done as part of routine cancer screening tests. How is this treated? Treatment for this condition involves removing any polyps that are found. Most polyps can be removed during a colonoscopy. Those polyps will then be tested for cancer. Additional treatment may be needed depending on the results of testing. Follow these instructions at home: Lifestyle  Maintain a healthy weight, or lose weight if recommended by your health care provider.  Exercise every day or as told by your health care provider.  Do not use any products that contain nicotine or tobacco, such as cigarettes and e-cigarettes. If you need help quitting, ask your health care provider.  If you drink alcohol, limit how much you have: ? 0-1 drink a day for women. ? 0-2 drinks a day for men.  Be aware of how much alcohol is in your drink. In the [place] one drink equals one 12 oz bottle of beer (355 mL), one 5 oz glass of wine (148 mL), or one 1 oz shot of hard liquor (44 mL). Eating and drinking   Eat foods that are high in fiber, such as fruits, vegetables, and whole grains.  Eat foods that are high in calcium  and vitamin D, such as milk, cheese, yogurt, eggs, liver, fish, and broccoli.  Limit foods that are high in fat, such as fried foods and desserts.  Limit the amount of red meat and processed meat you eat, such as hot dogs, sausage, bacon, and lunch meats. General instructions  Keep all follow-up visits as told by your health care provider. This is important. ? This includes having regularly scheduled colonoscopies. ? Talk to your health care provider about when you need a colonoscopy. Contact a health care provider if:  You have new or worsening bleeding during a bowel movement.  You have new or increased blood in your stool.  You have a change in bowel habits.  You lose weight for no known reason. Summary  Polyps are tissue growths inside the body. Polyps can grow in many places, including the colon.  Most colon polyps are noncancerous (benign), but some can become cancerous  over time.  This condition is diagnosed with a colonoscopy.  Treatment for this condition involves removing any polyps that are found. Most polyps can be removed during a colonoscopy. This information is not intended to replace advice given to you by your health care provider. Make sure you discuss any questions you have with your health care provider. Document Released: 09/29/2003 Document Revised: 04/19/2017 Document Reviewed: 04/19/2017 Elsevier Interactive Patient Education  2019 [employer].

## 2018-03-27 NOTE — Op Note (Signed)
[facility] Patient Name: [name] [name] Procedure Date: 03/27/2018 1:13 PM MRN: [id number] Date of Birth: 08-22-1965 Attending MD: Hildred Laser , MD CSN: [id number] Age: 53 Admit Type: Outpatient Procedure:                Colonoscopy Indications:              Rectal bleeding, Constipation Providers:                Hildred Laser, MD, Janeece Riggers, RN, Nelma Rothman,                            Technician Referring MD:             Elayne Snare Wolfgang Phoenix, MD Medicines:                Meperidine 50 mg IV, Midazolam 10 mg IV Complications:            No immediate complications. Estimated Blood Loss:     Estimated blood loss: none. Procedure:                Pre-Anesthesia Assessment:                           - Prior to the procedure, a History and Physical                            was performed, and patient medications and                            allergies were reviewed. The patient's tolerance of                            previous anesthesia was also reviewed. The risks                            and benefits of the procedure and the sedation                            options and risks were discussed with the patient.                            All questions were answered, and informed consent                            was obtained. Prior Anticoagulants: The patient                            last took aspirin 2 days prior to the procedure.                            ASA Grade Assessment: II - A patient with mild                            systemic disease. After reviewing the risks and  benefits, the patient was deemed in satisfactory                            condition to undergo the procedure.                           After obtaining informed consent, the colonoscope                            was passed under direct vision. Throughout the                            procedure, the patient's blood pressure, pulse, and                            oxygen  saturations were monitored continuously. The                            [id number]-H190DL ([id number]) scope was introduced through                            the anus and advanced to the the cecum, identified                            by appendiceal orifice and ileocecal valve. The                            colonoscopy was technically difficult and complex                            due to restricted mobility of the colon. The                            patient tolerated the procedure well. The quality                            of the bowel preparation was adequate. The                            ileocecal valve, appendiceal orifice, and rectum                            were photographed. Scope In: 1:27:38 PM Scope Out: 2:04:46 PM Scope Withdrawal Time: 0 hours 18 minutes 17 seconds  Total Procedure Duration: 0 hours 37 minutes 8 seconds  Findings:      The perianal and digital rectal examinations were normal.      A 4 to 7 mm polyp was found in the cecum. The polyp was sessile. The       polyp was removed with a cold snare. Resection and retrieval were       complete. To close a defect after polypectomy, one hemostatic clip was       successfully placed (MR conditional). There was no bleeding at the end       of the procedure. The pathology specimen  was placed into Bottle Number 1.      Two diverticula were found in the hepatic flexure.      The exam was otherwise normal throughout the examined colon.      External hemorrhoids were found during retroflexion. The hemorrhoids       were small. Impression:               - One 4 to 7 mm polyp in the cecum, removed with a                            cold snare. Resected and retrieved. Clip (MR                            conditional) was placed.                           - Diverticulosis at the hepatic flexure.                           - External hemorrhoids. Moderate Sedation:      Moderate (conscious) sedation was administered by the endoscopy  nurse       and supervised by the endoscopist. The following parameters were       monitored: oxygen saturation, heart rate, blood pressure, CO2       capnography and response to care. Total physician intraservice time was       42 minutes. Recommendation:           - Patient has a contact number available for                            emergencies. The signs and symptoms of potential                            delayed complications were discussed with the                            patient. Return to normal activities tomorrow.                            Written discharge instructions were provided to the                            patient.                           - High fiber diet today.                           - Continue present medications.                           - No aspirin, ibuprofen, naproxen, or other                            non-steroidal anti-inflammatory drugs for 3 days.                           -  Await pathology results.                           - Repeat colonoscopy is recommended. The                            colonoscopy date will be determined after pathology                            results from today's exam become available for                            review. Procedure Code(s):        --- Professional ---                           [phone], Colonoscopy, flexible; with removal of                            tumor(s), polyp(s), or other lesion(s) by snare                            technique                           99153, Moderate sedation; each additional 15                            minutes intraservice time                           99153, Moderate sedation; each additional 15                            minutes intraservice time                           G0500, Moderate sedation services provided by the                            same physician or other qualified health care                            professional performing a gastrointestinal                             endoscopic service that sedation supports,                            requiring the presence of an independent trained                            observer to assist in the monitoring of the                            patient's level of consciousness and physiological  status; initial 15 minutes of intra-service time;                            patient age 49 years or older (additional time may                            be reported with [phone], as appropriate) Diagnosis Code(s):        --- Professional ---                           D12.0, Benign neoplasm of cecum                           K64.4, Residual hemorrhoidal skin tags                           K62.5, Hemorrhage of anus and rectum                           K59.00, Constipation, unspecified                           K57.30, Diverticulosis of large intestine without                            perforation or abscess without bleeding CPT copyright 2018 [employer]. All rights reserved. The codes documented in this report are preliminary and upon coder review may  be revised to meet current compliance requirements. Hildred Laser, MD Hildred Laser, MD 03/27/2018 2:16:17 PM This report has been signed electronically. Number of Addenda: 0

## 2018-03-27 NOTE — Interval H&P Note (Signed)
No change in patient's history since she was last seen 2 days ago. No change in patient's exam since office visit. History and Physical Interval Note:  03/27/2018 1:17 PM  [name] [name]  has presented today for surgery, with the diagnosis of Chronic Constipation , Rectal Bleeding.  The various methods of treatment have been discussed with the patient and family. After consideration of risks, benefits and other options for treatment, the patient has consented to  Procedure(s) with comments: COLONOSCOPY (N/A) - 100 as a surgical intervention.  The patient's history has been reviewed, patient examined, no change in status, stable for surgery.  I have reviewed the patient's chart and labs.  Questions were answered to the patient's satisfaction.     [employer]

## 2018-04-01 ENCOUNTER — Encounter (HOSPITAL_COMMUNITY): Payer: Self-pay | Admitting: Internal Medicine

## 2018-04-01 ENCOUNTER — Ambulatory Visit (INDEPENDENT_AMBULATORY_CARE_PROVIDER_SITE_OTHER): Payer: Self-pay | Admitting: Internal Medicine

## 2018-05-08 ENCOUNTER — Encounter: Payer: Self-pay | Admitting: Adult Health

## 2018-05-29 ENCOUNTER — Other Ambulatory Visit: Payer: Self-pay | Admitting: Adult Health

## 2018-07-18 ENCOUNTER — Telehealth: Payer: Self-pay | Admitting: Adult Health

## 2018-07-18 NOTE — Telephone Encounter (Signed)

## 2018-07-22 ENCOUNTER — Other Ambulatory Visit: Payer: Self-pay

## 2018-07-22 ENCOUNTER — Other Ambulatory Visit: Payer: Managed Care, Other (non HMO) | Admitting: Adult Health

## 2018-07-23 ENCOUNTER — Ambulatory Visit (INDEPENDENT_AMBULATORY_CARE_PROVIDER_SITE_OTHER): Payer: Managed Care, Other (non HMO) | Admitting: Family Medicine

## 2018-07-23 DIAGNOSIS — R51 Headache: Secondary | ICD-10-CM

## 2018-07-23 DIAGNOSIS — R5383 Other fatigue: Secondary | ICD-10-CM | POA: Diagnosis not present

## 2018-07-23 DIAGNOSIS — R519 Headache, unspecified: Secondary | ICD-10-CM

## 2018-07-23 DIAGNOSIS — R0981 Nasal congestion: Secondary | ICD-10-CM | POA: Diagnosis not present

## 2018-07-23 DIAGNOSIS — R55 Syncope and collapse: Secondary | ICD-10-CM | POA: Diagnosis not present

## 2018-07-23 NOTE — Progress Notes (Signed)
Subjective:    Patient ID: [name] [name], female    DOB: 21-Dec-1965, 53 y.o.   MRN: [id number] Telephone only video failed Headache  This is a new problem. Pertinent negatives include no abdominal pain, coughing, dizziness, fever, nausea or rhinorrhea. Associated symptoms comments: fatigue.   Patient states she was a cookout over weekend outside in head and had a headache and blacked out twice for about 5 minutes each time  Thinks she may need referral to neurology- was unable to go last time referred due to losing insurance.  The patient states that she was hanging out with a friend and started feeling lightheaded and then states that she passed out for for 5 minutes she states she came to she walked to the car and then while she was by the car she passed out again for another for 5 minutes there was no sign of any seizure she states that she did not feel any arrhythmia issues and is not had this happen before  She also relates she is having frequent headaches multiple times per month throbbing nausea with it may sound like migraines but she is getting concerned about these and would like to be seen by neurology.  She denies being dehydrated or having other issues when she passed out Virtual Visit via Video Note  I connected with [name] [name] on 07/23/18 at  3:00 PM EDT by a video enabled telemedicine application and verified that I am speaking with the correct person using two identifiers.  Location: Patient: home  Provider: office   I discussed the limitations of evaluation and management by telemedicine and the availability of in person appointments. The patient expressed understanding and agreed to proceed.  History of Present Illness:    Observations/Objective:   Assessment and Plan:   Follow Up Instructions:    I discussed the assessment and treatment plan with the patient. The patient was provided an opportunity to ask questions and all were answered. The patient  agreed with the plan and demonstrated an understanding of the instructions.   The patient was advised to call back or seek an in-person evaluation if the symptoms worsen or if the condition fails to improve as anticipated.  I provided 15 minutes of non-face-to-face time during this encounter.       Review of Systems  Constitutional: Negative for activity change, fatigue and fever.  HENT: Negative for congestion and rhinorrhea.   Respiratory: Negative for cough, chest tightness and shortness of breath.   Cardiovascular: Negative for chest pain and leg swelling.  Gastrointestinal: Negative for abdominal pain and nausea.  Skin: Negative for color change.  Neurological: Positive for syncope and headaches. Negative for dizziness.  Psychiatric/Behavioral: Negative for agitation and behavioral problems.       Objective:   Physical Exam Today's visit was via telephone Physical exam was not possible for this visit        Assessment & Plan:  Syncope it is quite possible this was vasovagal related to the heat of the day the patient is concerned that she has underlying neurologic issue  Patient was encouraged to stay well-hydrated and be cautious in the hot weather she will do lab work  Because of the frequent headaches we will refer her to neurology for further evaluation and the patient will follow-up with [place] if having any ongoing troubles or problems  Patient having frequent head congestion and nasal stuffiness this is ongoing I asked the patient if she is using any allergy  medicines since she is not I encourage her to do allergy tablet and Flonase spray for 1 month if that helps stick with it if it does not help notify us and we will help set her up with ENT to evaluate for nasal blockage

## 2018-07-25 ENCOUNTER — Encounter: Payer: Self-pay | Admitting: Family Medicine

## 2018-07-26 LAB — BASIC METABOLIC PANEL
BUN/Creatinine Ratio: 18 (ref 9–23)
BUN: 14 mg/dL (ref 6–24)
CO2: 24 mmol/L (ref 20–29)
Calcium: 10 mg/dL (ref 8.7–10.2)
Chloride: 106 mmol/L (ref 96–106)
Creatinine, Ser: 0.79 mg/dL (ref 0.57–1.00)
GFR calc Af Amer: 100 mL/min/{1.73_m2} (ref >59)
GFR calc non Af Amer: 86 mL/min/{1.73_m2} (ref >59)
Glucose: 88 mg/dL (ref 65–99)
Potassium: 4.5 mmol/L (ref 3.5–5.2)
Sodium: 143 mmol/L (ref 134–144)

## 2018-07-26 LAB — CBC WITH DIFFERENTIAL/PLATELET
Basophils Absolute: 0 10*3/uL (ref 0.0–0.2)
Basos: 1 %
EOS (ABSOLUTE): 0.2 10*3/uL (ref 0.0–0.4)
Eos: 4 %
Hematocrit: 39.4 % (ref 34.0–46.6)
Hemoglobin: 13.2 g/dL (ref 11.1–15.9)
Immature Grans (Abs): 0 10*3/uL (ref 0.0–0.1)
Immature Granulocytes: 0 %
Lymphocytes Absolute: 1.2 10*3/uL (ref 0.7–3.1)
Lymphs: 32 %
MCH: 28.9 pg (ref 26.6–33.0)
MCHC: 33.5 g/dL (ref 31.5–35.7)
MCV: 86 fL (ref 79–97)
Monocytes Absolute: 0.4 10*3/uL (ref 0.1–0.9)
Monocytes: 9 %
Neutrophils Absolute: 2 10*3/uL (ref 1.4–7.0)
Neutrophils: 54 %
Platelets: 260 10*3/uL (ref 150–450)
RBC: 4.56 x10E6/uL (ref 3.77–5.28)
RDW: 13 % (ref 11.7–15.4)
WBC: 3.8 10*3/uL (ref 3.4–10.8)

## 2018-07-26 LAB — HEPATIC FUNCTION PANEL
ALT: 24 IU/L (ref 0–32)
AST: 28 IU/L (ref 0–40)
Albumin: 4.6 g/dL (ref 3.8–4.9)
Alkaline Phosphatase: 82 IU/L (ref 39–117)
Bilirubin Total: 0.5 mg/dL (ref 0.0–1.2)
Bilirubin, Direct: 0.16 mg/dL (ref 0.00–0.40)
Total Protein: 7 g/dL (ref 6.0–8.5)

## 2018-07-27 ENCOUNTER — Encounter: Payer: Self-pay | Admitting: Family Medicine

## 2018-08-20 ENCOUNTER — Telehealth: Payer: Self-pay | Admitting: *Deleted

## 2018-08-20 ENCOUNTER — Encounter: Payer: Self-pay | Admitting: Neurology

## 2018-08-20 ENCOUNTER — Ambulatory Visit: Payer: Managed Care, Other (non HMO) | Admitting: Neurology

## 2018-08-20 ENCOUNTER — Other Ambulatory Visit: Payer: Self-pay

## 2018-08-20 DIAGNOSIS — R9082 White matter disease, unspecified: Secondary | ICD-10-CM | POA: Insufficient documentation

## 2018-08-20 DIAGNOSIS — R42 Dizziness and giddiness: Secondary | ICD-10-CM | POA: Diagnosis not present

## 2018-08-20 DIAGNOSIS — G4489 Other headache syndrome: Secondary | ICD-10-CM | POA: Diagnosis not present

## 2018-08-20 DIAGNOSIS — R55 Syncope and collapse: Secondary | ICD-10-CM

## 2018-08-20 MED ORDER — IMIPRAMINE HCL 25 MG PO TABS: 25.0000 mg | ORAL_TABLET | Freq: Every day | ORAL | 5 refills | Status: DC

## 2018-08-20 NOTE — Telephone Encounter (Signed)
Request made to [facility]

## 2018-08-20 NOTE — Progress Notes (Signed)
[facility]  PATIENT: [name] [name] DOB: 22-Mar-1965  REFERRING DOCTOR OR PCP:  Sallee Lange SOURCE: Patient, notes from Dr. Wolfgang Phoenix,  _________________________________   HISTORICAL  CHIEF COMPLAINT:  Chief Complaint  Patient presents with  . New Patient (Initial Visit)    RM 12 with Marlou Sa (boyfriend), temp: 98.4. Internal referral for headaches from Sallee Lange, MD (PCP)  . Headache    Started in the last year. She gets nauseaous, light sensitive when driving in the car. First time she has seen neurologist.    HISTORY OF PRESENT ILLNESS:  I had the pleasure seeing your patient, [name] [name], at [facility] for neurologic consultation regarding headaches and episodes of vertigo and syncope.  She is a 53 year old woman who has had headaches the last year.  They have become more frequent and more intense.   Current headache is over the left eye and back to the occiput.  The headache has fluctuated x 4-5 days.   She woke up with this headache.   However, headaches can beon the other side and sometimes develop during the day as well.   She has about 10 headache days a month.  In between headaches, she feels good.  When headache is present, she gets nausea if pain is more intense.    She notes lights sometimes bother her but usually do not.   She has no photophobia.   Moving does not change the headache much.   She sometimes Aleve but it has not helped.    She notes some pain in the occiput with most headaches.     She has had some episodes of vertigo with changes in position, lasting hours to a day or two.  She does not get nausea or vomiting.  She feels a spinning sensation and her balance seems off when these occur.   She has had these spells x many years and the last one was 2 months ago.      She had two episodes of syncope 07/20/2018.Marland Kitchen   She felt lightheaded for a few seconds first.   She had been outside the entire day.    She was standing with  both episodes about a minute after getting up.   She had 2 alcoholic drinks.    An MRI of the brain 04/2017 [facility]) showed 'nonspecific white matter changes.    She does not have HTN or DM.   She smoked x 9 years, stopping in 1996.       REVIEW OF SYSTEMS: Constitutional: No fevers, chills, sweats, or change in appetite Eyes: No visual changes, double vision, eye pain Ear, nose and throat: No hearing loss, ear pain, nasal congestion, sore throat Cardiovascular: No chest pain, palpitations Respiratory: No shortness of breath at rest or with exertion.   No wheezes GastrointestinaI: No nausea, vomiting, diarrhea, abdominal pain, fecal incontinence Genitourinary: No dysuria, urinary retention or frequency.  No nocturia. Musculoskeletal: No neck pain, back pain Integumentary: No rash, pruritus, skin lesions Neurological: as above Psychiatric: No depression at this time.  No anxiety Endocrine: No palpitations, diaphoresis, change in appetite, change in weigh or increased thirst Hematologic/Lymphatic: No anemia, purpura, petechiae. Allergic/Immunologic: No itchy/runny eyes, nasal congestion, recent allergic reactions, rashes  ALLERGIES: Allergies  Allergen Reactions  . Codeine Nausea And Vomiting  . Keflex [Cephalexin] Swelling  . Penicillins Nausea And Vomiting    Has patient had a PCN reaction causing immediate rash, facial/tongue/throat swelling, SOB or lightheadedness with hypotension: No  Has patient had a PCN reaction causing severe rash involving mucus membranes or skin necrosis: No Has patient had a PCN reaction that required hospitalization No Has patient had a PCN reaction occurring within the last 10 years: No If all of the above answers are "NO", then may proceed with Cephalosporin use.   [place] [Levofloxacin In D5w] Swelling and Rash    Tongue, throat swelling, oral rash-patient has taken Cipro and tolerated this medication. Although the cause it is a similar  medicine we try to avoid Cipro    HOME MEDICATIONS:  Current Outpatient Medications:  .  ALPRAZolam (XANAX) 0.5 MG tablet, TAKE 1/2 TO 1 TABLET BY MOUTH TWICE A DAY IF NEEDED (Patient taking differently: Take 0.25-0.5 mg by mouth 2 (two) times daily as needed for anxiety. TAKE 1/2 TO 1 TABLET BY MOUTH TWICE A DAY IF NEEDED), Disp: 28 tablet, Rfl: 2 .  Ascorbic Acid (VITA-C PO), Take 500 mg by mouth daily., Disp: , Rfl:  .  aspirin EC 81 MG tablet, Take 1 tablet (81 mg total) by mouth daily., Disp: , Rfl:  .  CALCIUM PO, Take by mouth., Disp: , Rfl:  .  docusate sodium (COLACE) 100 MG capsule, Take 2 capsules (200 mg total) by mouth daily. (Patient taking differently: Take 200 mg by mouth daily as needed for mild constipation. ), Disp: 10 capsule, Rfl: 0 .  famotidine (PEPCID) 20 MG tablet, Take 1 tablet (20 mg total) by mouth daily as needed for heartburn or indigestion., Disp: , Rfl:  .  Flaxseed, Linseed, (FLAXSEED OIL PO), Take by mouth., Disp: , Rfl:  .  naproxen sodium (ALEVE) 220 MG tablet, Take 1 tablet (220 mg total) by mouth daily as needed. (Patient taking differently: Take 440 mg by mouth daily as needed. ), Disp: , Rfl:  .  imipramine (TOFRANIL) 25 MG tablet, Take 1 tablet (25 mg total) by mouth at bedtime., Disp: 30 tablet, Rfl: 5  PAST MEDICAL HISTORY: Past Medical History:  Diagnosis Date  . Contraceptive management 11/13/2012  . Frequent UTI    after sex  . Hemorrhoids 04/02/2015  . Pain    back  . Rectal fissure 03/30/2014  . Vertigo     PAST SURGICAL HISTORY: Past Surgical History:  Procedure Laterality Date  . CESAREAN SECTION    . COLONOSCOPY     10 years ago - Dr.Fields @ [facility]  . COLONOSCOPY N/A 03/27/2018   Procedure: COLONOSCOPY;  Surgeon: Rogene Houston, MD;  Location: [facility];  Service: Endoscopy;  Laterality: N/A;  100  . POLYPECTOMY  03/27/2018   Procedure: POLYPECTOMY;  Surgeon: Rogene Houston, MD;  Location: [facility];  Service: Endoscopy;;     FAMILY HISTORY: Family History  Problem Relation Age of Onset  . Diabetes Mother   . Hypertension Mother   . Diabetes Father   . Heart disease Father   . Stroke Father   . Kidney failure Father   . Cirrhosis Father   . Hypertension Brother   . Cancer Maternal Aunt        colon,liver  . Cancer Maternal Uncle        lung  . Heart disease Paternal Grandfather   . Alzheimer's disease Paternal Grandmother     SOCIAL HISTORY:  Social History   Socioeconomic History  . Marital status: Divorced    Spouse name: Not on file  . Number of children: Not on file  . Years of education: Not on file  .  Highest education level: Not on file  Occupational History  . Not on file  Social Needs  . Financial resource strain: Not on file  . Food insecurity    Worry: Not on file    Inability: Not on file  . Transportation needs    Medical: Not on file    Non-medical: Not on file  Tobacco Use  . Smoking status: Former Smoker    Types: Cigarettes    Start date: 10/01/1987    Quit date: 10/01/1994    Years since quitting: 23.9  . Smokeless tobacco: Never Used  Substance and Sexual Activity  . Alcohol use: Yes    Comment: occ  . Drug use: No  . Sexual activity: Yes    Birth control/protection: None  Lifestyle  . Physical activity    Days per week: Not on file    Minutes per session: Not on file  . Stress: Not on file  Relationships  . Social Herbalist on phone: Not on file    Gets together: Not on file    Attends religious service: Not on file    Active member of club or organization: Not on file    Attends meetings of clubs or organizations: Not on file    Relationship status: Not on file  . Intimate partner violence    Fear of current or ex partner: Not on file    Emotionally abused: Not on file    Physically abused: Not on file    Forced sexual activity: Not on file  Other Topics Concern  . Not on file  Social History Narrative   Right handed    Caffeine  use: coffee every morning     PHYSICAL EXAM  Vitals:   08/20/18 1031  BP: 123/80  Pulse: (!) 59  Temp: 98.6 F (37 C)  Weight: 129 lb 4 oz (58.6 kg)  Height: 5\' 1"  (1.549 m)    Body mass index is 24.42 kg/m.   General: The patient is well-developed and well-nourished and in no acute distress  HEENT:  Head is [place]/AT.  Sclera are anicteric.  Funduscopic exam shows normal optic discs and retinal vessels.  Neck: No carotid bruits are noted.  The neck is nontender.  Cardiovascular: The heart has a regular rate and rhythm with a normal S1 and S2. There were no murmurs, gallops or rubs.    Skin: Extremities are without rash or  edema.  Musculoskeletal:  Back is nontender  Neurologic Exam  Mental status: The patient is alert and oriented x 3 at the time of the examination. The patient has apparent normal recent and remote memory, with an apparently normal attention span and concentration ability.   Speech is normal.  Cranial nerves: Extraocular movements are full. Pupils are equal, round, and reactive to light and accomodation.  Visual fields are full.  Facial symmetry is present. There is good facial sensation to soft touch bilaterally.Facial strength is normal.  Trapezius and sternocleidomastoid strength is normal. No dysarthria is noted.  The tongue is midline, and the patient has symmetric elevation of the soft palate. No obvious hearing deficits are noted.  Motor:  Muscle bulk is normal.   Tone is normal. Strength is  5 / 5 in all 4 extremities.   Sensory: Sensory testing is intact to pinprick, soft touch and vibration sensation in all 4 extremities.  Coordination: Cerebellar testing reveals good finger-nose-finger and heel-to-shin bilaterally.  Gait and station: Station is normal.  Gait is normal. Tandem gait is slightly wide. Romberg is negative.   Reflexes: Deep tendon reflexes are symmetric and normal bilaterally.   Plantar responses are flexor.    DIAGNOSTIC DATA  (LABS, IMAGING, TESTING) - I reviewed patient records, labs, notes, testing and imaging myself where available.  Lab Results  Component Value Date   WBC 3.8 07/25/2018   HGB 13.2 07/25/2018   HCT 39.4 07/25/2018   MCV 86 07/25/2018   PLT 260 07/25/2018      Component Value Date/Time   NA 143 07/25/2018 0855   K 4.5 07/25/2018 0855   CL 106 07/25/2018 0855   CO2 24 07/25/2018 0855   GLUCOSE 88 07/25/2018 0855   GLUCOSE 99 04/23/2016 1355   BUN 14 07/25/2018 0855   CREATININE 0.79 07/25/2018 0855   CREATININE 0.75 10/15/2015 1031   CALCIUM 10.0 07/25/2018 0855   PROT 7.0 07/25/2018 0855   ALBUMIN 4.6 07/25/2018 0855   AST 28 07/25/2018 0855   ALT 24 07/25/2018 0855   ALKPHOS 82 07/25/2018 0855   BILITOT 0.5 07/25/2018 0855   GFRNONAA 86 07/25/2018 0855   GFRAA 100 07/25/2018 0855   Lab Results  Component Value Date   CHOL 172 05/01/2017   HDL 73 05/01/2017   LDLCALC 81 05/01/2017   TRIG 91 05/01/2017   CHOLHDL 2.4 05/01/2017   Lab Results  Component Value Date   HGBA1C 5.5 10/15/2015   No results found for: [id number] Lab Results  Component Value Date   TSH 0.997 05/01/2017       ASSESSMENT AND PLAN  1. Other headache syndrome   2. Vertigo   3. Syncope, unspecified syncope type   4. White matter abnormality on MRI of brain     In summary, Ms. 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  Her examination is essentially normal.  We have requested her MRI that I can personally review the images.  It was read as mildly abnormal with white matter hyperintense changes.  Because of the abnormal findings, if her symptoms worsen we would need to follow-up with an additional scan, with and without contrast, to compare with the 2019 study.  I will place her on imipramine to see if we can get her headaches under better control.  She had only 2 episodes of syncope, both the same day when she was outside in warm weather.   Most likely, she was volume depleted as the episodes were preceded by a feeling of lightheadedness.  If she has more events, we will need to consider obtaining an EEG.  She will return to see me in 2 months but call sooner if new or worsening neurologic symptoms.  Consider changing the imipramine to zonisamide if there is no improvement in the pain and also consider reimaging if symptoms worsen.  Thank you for asking me to see Ms. Motsinger.  Please let me know and be of further assistance with her or other patients in the future.  Bellatrix Devonshire A. Felecia Shelling, MD, [facility] Shave 02/21/6387, 3:73 PM Certified in Neurology, Clinical Neurophysiology, Sleep Medicine and Neuroimaging  [facility] [address] [place], [place] [address] [phone]

## 2018-08-22 ENCOUNTER — Telehealth: Payer: Self-pay | Admitting: Neurology

## 2018-08-22 NOTE — Telephone Encounter (Signed)
I reviewed the MRI of the brain from 05/01/2017.  It shows T2/flair hyperintense foci in the subcortical and deep white matter.  This is nonspecific but most consistent with chronic microvascular ischemic change.    The appearance is not typical for demyelination.  There were no acute findings.

## 2018-08-30 ENCOUNTER — Telehealth: Payer: Self-pay | Admitting: Family Medicine

## 2018-08-30 ENCOUNTER — Other Ambulatory Visit: Payer: Self-pay | Admitting: *Deleted

## 2018-08-30 ENCOUNTER — Telehealth: Payer: Self-pay | Admitting: Adult Health

## 2018-08-30 MED ORDER — NITROFURANTOIN MONOHYD MACRO 100 MG PO CAPS: 100.0000 mg | ORAL_CAPSULE | Freq: Two times a day (BID) | ORAL | 0 refills | Status: DC

## 2018-08-30 NOTE — Telephone Encounter (Signed)
Discussed with pt. Pt verbalized understanding of all. Med sent to pharm.

## 2018-08-30 NOTE — Telephone Encounter (Signed)
Please advise. Thank you

## 2018-08-30 NOTE — Telephone Encounter (Signed)
Pt has UTI and would like something called in for it. She is having burning and frequent urination.   [id number] - [place], [place] [address]

## 2018-08-30 NOTE — Telephone Encounter (Signed)
Pt is calling to request medication for a UTI.

## 2018-08-30 NOTE — Telephone Encounter (Signed)
Typically we would do a virtual visit or office visit but currently we are totally full through the evening Because of all this we can send in antibiotics since it appears to be a straightforward UTI If the patient is willing to do so then do the following Macrobid 100 mg 1 twice daily for 5 days May use OTC Azo-Standard as needed If high fevers chills sweats or other problems notify us or be seen in urgent care or ER

## 2018-08-30 NOTE — Telephone Encounter (Signed)
LVM for pt to call back to schedule or to call PCP.

## 2018-10-02 ENCOUNTER — Encounter: Payer: Self-pay | Admitting: Family Medicine

## 2018-10-02 ENCOUNTER — Ambulatory Visit (INDEPENDENT_AMBULATORY_CARE_PROVIDER_SITE_OTHER): Payer: Managed Care, Other (non HMO) | Admitting: Family Medicine

## 2018-10-02 ENCOUNTER — Other Ambulatory Visit: Payer: Self-pay

## 2018-10-02 VITALS — BP 118/76 | Temp 98.1°F | Ht 61.0 in | Wt 131.0 lb

## 2018-10-02 DIAGNOSIS — N390 Urinary tract infection, site not specified: Secondary | ICD-10-CM | POA: Diagnosis not present

## 2018-10-02 DIAGNOSIS — R300 Dysuria: Secondary | ICD-10-CM | POA: Diagnosis not present

## 2018-10-02 LAB — POCT URINALYSIS DIPSTICK
Blood, UA: POSITIVE
Spec Grav, UA: <=1.005 — AB (ref 1.010–1.025)
pH, UA: 6 (ref 5.0–8.0)

## 2018-10-02 MED ORDER — SULFAMETHOXAZOLE-TRIMETHOPRIM 800-160 MG PO TABS: 1.0000 | ORAL_TABLET | Freq: Two times a day (BID) | ORAL | 0 refills | Status: DC

## 2018-10-02 MED ORDER — PHENAZOPYRIDINE HCL 100 MG PO TABS: 100.0000 mg | ORAL_TABLET | Freq: Three times a day (TID) | ORAL | 1 refills | Status: DC | PRN

## 2018-10-02 NOTE — Progress Notes (Signed)
   Subjective:    Patient ID: [name] [name], female    DOB: December 03, 1965, 53 y.o.   MRN: [id number]  Urinary Tract Infection  This is a new problem. Episode onset: 2 days. Associated symptoms include frequency. Pertinent negatives include no flank pain, hematuria, nausea or vomiting. Associated symptoms comments: Pain and frequency.  Patient denies high fever chills flank pain nausea vomiting diarrhea  She has frequent UTIs it frustrates the patient.  She wonders if there may be something going on she saw her urologist who really did not have much to offer    Review of Systems  Constitutional: Negative for activity change and appetite change.  HENT: Negative for congestion and rhinorrhea.   Respiratory: Negative for cough and shortness of breath.   Cardiovascular: Negative for chest pain and leg swelling.  Gastrointestinal: Negative for abdominal pain, nausea and vomiting.  Genitourinary: Positive for dysuria and frequency. Negative for flank pain and hematuria.  Skin: Negative for color change.  Neurological: Negative for dizziness and weakness.  Psychiatric/Behavioral: Negative for agitation and confusion.       Objective:   Physical Exam Vitals signs reviewed.  Constitutional:      General: She is not in acute distress. HENT:     Head: Normocephalic.  Cardiovascular:     Rate and Rhythm: Normal rate and regular rhythm.     Heart sounds: Normal heart sounds. No murmur.  Pulmonary:     Effort: Pulmonary effort is normal.     Breath sounds: Normal breath sounds.  Lymphadenopathy:     Cervical: No cervical adenopathy.  Neurological:     Mental Status: She is alert.  Psychiatric:        Behavior: Behavior normal.           Assessment & Plan:  UTI Urine culture sent Urinalysis shows WBCs Go ahead with Bactrim twice daily for the next 7 days  Referral to urogynecology specialist at [facility] office in [place] for further evaluation.  Patient is previously seen urology  but they did not have much to offer regarding this issue patient interested in further opinion

## 2018-10-05 LAB — URINE CULTURE

## 2018-10-07 ENCOUNTER — Encounter: Payer: Self-pay | Admitting: Family Medicine

## 2018-10-09 ENCOUNTER — Telehealth: Payer: Self-pay | Admitting: Family Medicine

## 2018-10-09 MED ORDER — PHENAZOPYRIDINE HCL 100 MG PO TABS: 100.0000 mg | ORAL_TABLET | Freq: Three times a day (TID) | ORAL | 1 refills | Status: DC | PRN

## 2018-10-09 MED ORDER — FLUCONAZOLE 150 MG PO TABS: ORAL_TABLET | ORAL | 0 refills | Status: DC

## 2018-10-09 MED ORDER — NITROFURANTOIN MONOHYD MACRO 100 MG PO CAPS: ORAL_CAPSULE | ORAL | 0 refills | Status: DC

## 2018-10-09 MED ORDER — CIPROFLOXACIN HCL 250 MG PO TABS: ORAL_TABLET | ORAL | 0 refills | Status: DC

## 2018-10-09 NOTE — Telephone Encounter (Signed)
Finished her antibiotic a couple days ago for UTI and now all symptoms are coming back.  She is wanting to get a refill on Bactrim and Pyridium or change it to something else.  [facility] [address]

## 2018-10-09 NOTE — Telephone Encounter (Signed)
Medication sent in. Left message to return call 

## 2018-10-09 NOTE — Telephone Encounter (Signed)
Patient returned our phone call and I told her that cipro was sent in to the pharmacy. She said that was fine.  She also wanted [place] to go ahead and call in diflucan to [facility] because she says she will probably get a yeast infection from the antibiotic. No need to call patient back, she will check with the pharmacy.  [facility] [address]

## 2018-10-09 NOTE — Telephone Encounter (Signed)
cipro 250 bid 7 d 

## 2018-10-09 NOTE — Telephone Encounter (Signed)
Pt returned call and verbalized understanding  

## 2018-10-09 NOTE — Telephone Encounter (Signed)
See note from dr scott couple days ago, need to change to more effective abx for this culture anyway, macrobid 100 bid 7 d, refill pyridium

## 2018-10-09 NOTE — Telephone Encounter (Signed)
Diflucan sent to pharmacy.

## 2018-10-09 NOTE — Telephone Encounter (Signed)
Contacted pt to inform her the we sent in [place]. Pt states that she had Macrobid before and doesn't feel that it helped.   Result Note: Pt contacted. Pt states that either Keflex or Levaquin causes her tongue to swell. Pt states she had Macrobid before and doesn't feel that it helped. Pt can tolerate Cipro. Pt is going on vacation Oct 4 and would like this cleared up by then. Please advise.

## 2018-10-09 NOTE — Telephone Encounter (Signed)
Please advise. Thank you

## 2018-10-23 ENCOUNTER — Ambulatory Visit: Payer: Managed Care, Other (non HMO) | Admitting: Urology

## 2018-10-30 ENCOUNTER — Ambulatory Visit: Payer: Managed Care, Other (non HMO) | Admitting: Neurology

## 2018-10-30 ENCOUNTER — Encounter: Payer: Self-pay | Admitting: Neurology

## 2018-10-30 ENCOUNTER — Other Ambulatory Visit: Payer: Self-pay

## 2018-10-30 VITALS — BP 135/74 | HR 59 | Temp 98.4°F | Ht 61.0 in | Wt 129.0 lb

## 2018-10-30 DIAGNOSIS — R9082 White matter disease, unspecified: Secondary | ICD-10-CM | POA: Diagnosis not present

## 2018-10-30 DIAGNOSIS — F410 Panic disorder [episodic paroxysmal anxiety] without agoraphobia: Secondary | ICD-10-CM

## 2018-10-30 DIAGNOSIS — G4489 Other headache syndrome: Secondary | ICD-10-CM | POA: Diagnosis not present

## 2018-10-30 DIAGNOSIS — F411 Generalized anxiety disorder: Secondary | ICD-10-CM | POA: Diagnosis not present

## 2018-10-30 MED ORDER — ESCITALOPRAM OXALATE 5 MG PO TABS: 5.0000 mg | ORAL_TABLET | Freq: Every day | ORAL | 5 refills | Status: DC

## 2018-10-30 NOTE — Progress Notes (Signed)
[facility]  PATIENT: [name] [name] DOB: 01-Jun-1965  REFERRING DOCTOR OR PCP:  Sallee Lange SOURCE: Patient, notes from Dr. Wolfgang Phoenix,  _________________________________   HISTORICAL  CHIEF COMPLAINT:  Chief Complaint  Patient presents with  . Follow-up    RM 12 with husband (temp:98.7). Last seen 08/20/2018.   Marland Kitchen Headache    Pt never picked up imipramine from pharmacy, stated she "does not like to take medication"    HISTORY OF PRESENT ILLNESS:  Hadassah Potenza, is a 53 year old woman with headaches.    Update 10/30/2018: She reports her headaches are doing better.   She never tried imipramine.   She prefers not to take medications  She has episodes with a nervous sensation , averaging 3-4 a month with two the last week --- feels panicky and shaking x 10-20 minutes.   She feels short of breath and lightheaded.   When the epsode is over, she feels a little weak.    She has had these x may years but they have occurred more recently.   Only one occurred while driving home and a few occurred at work but most occur randomly.     She had an especially bad one  She states she is not an anxious person in general but she is constantly moving her legs and does not like to sit still.   She was placed on xanax in the past and is not sure it helped the spells.   She had been on sertraline in the past but stopped because she did not like how she felt.    I reviewed the MRI of the brain from 05/01/2017 and discussed with her.  It shows T2/flair hyperintense foci in the subcortical and deep white matter.  This is nonspecific but most consistent with chronic microvascular ischemic change.     She has only a 9 year history of smoking and no DM or HTN.       From Initial Consult: who as had headaches the last year.  They have become more frequent and more intense.   Current headache is over the left eye and back to the occiput.  The headache has fluctuated x 4-5 days.   She woke up  with this headache.   However, headaches can beon the other side and sometimes develop during the day as well.   She has about 10 headache days a month.  In between headaches, she feels good.  When headache is present, she gets nausea if pain is more intense.    She notes lights sometimes bother her but usually do not.   She has no photophobia.   Moving does not change the headache much.   She sometimes Aleve but it has not helped.    She notes some pain in the occiput with most headaches.     She has had some episodes of vertigo with changes in position, lasting hours to a day or two.  She does not get nausea or vomiting.  She feels a spinning sensation and her balance seems off when these occur.   She has had these spells x many years and the last one was 2 months ago.      She had two episodes of syncope 07/20/2018.Marland Kitchen   She felt lightheaded for a few seconds first.   She had been outside the entire day.    She was standing with both episodes about a minute after getting up.   She had 2  alcoholic drinks.    An MRI of the brain 04/2017 [facility]) showed 'nonspecific white matter changes.    She does not have HTN or DM.   She smoked x 9 years, stopping in 1996.       REVIEW OF SYSTEMS: Constitutional: No fevers, chills, sweats, or change in appetite Eyes: No visual changes, double vision, eye pain Ear, nose and throat: No hearing loss, ear pain, nasal congestion, sore throat Cardiovascular: No chest pain, palpitations Respiratory: No shortness of breath at rest or with exertion.   No wheezes GastrointestinaI: No nausea, vomiting, diarrhea, abdominal pain, fecal incontinence Genitourinary: No dysuria, urinary retention or frequency.  No nocturia. Musculoskeletal: No neck pain, back pain Integumentary: No rash, pruritus, skin lesions Neurological: as above Psychiatric: No depression at this time.  No anxiety Endocrine: No palpitations, diaphoresis, change in appetite, change in weigh or  increased thirst Hematologic/Lymphatic: No anemia, purpura, petechiae. Allergic/Immunologic: No itchy/runny eyes, nasal congestion, recent allergic reactions, rashes  ALLERGIES: Allergies  Allergen Reactions  . Codeine Nausea And Vomiting  . Keflex [Cephalexin] Swelling  . Penicillins Nausea And Vomiting    Has patient had a PCN reaction causing immediate rash, facial/tongue/throat swelling, SOB or lightheadedness with hypotension: No Has patient had a PCN reaction causing severe rash involving mucus membranes or skin necrosis: No Has patient had a PCN reaction that required hospitalization No Has patient had a PCN reaction occurring within the last 10 years: No If all of the above answers are "NO", then may proceed with Cephalosporin use.   Mack Hook [Levofloxacin In D5w] Swelling and Rash    Tongue, throat swelling, oral rash-patient has taken Cipro and tolerated this medication. Although the cause it is a similar medicine we try to avoid Cipro    HOME MEDICATIONS:  Current Outpatient Medications:  .  ALPRAZolam (XANAX) 0.5 MG tablet, TAKE 1/2 TO 1 TABLET BY MOUTH TWICE A DAY IF NEEDED (Patient taking differently: Take 0.25-0.5 mg by mouth 2 (two) times daily as needed for anxiety. TAKE 1/2 TO 1 TABLET BY MOUTH TWICE A DAY IF NEEDED), Disp: 28 tablet, Rfl: 2 .  Ascorbic Acid (VITA-C PO), Take 500 mg by mouth daily., Disp: , Rfl:  .  aspirin EC 81 MG tablet, Take 1 tablet (81 mg total) by mouth daily., Disp: , Rfl:  .  CALCIUM PO, Take by mouth., Disp: , Rfl:  .  ciprofloxacin (CIPRO) 250 MG tablet, Take one tablet po BID for 7 days, Disp: 14 tablet, Rfl: 0 .  docusate sodium (COLACE) 100 MG capsule, Take 2 capsules (200 mg total) by mouth daily. (Patient taking differently: Take 200 mg by mouth daily as needed for mild constipation. ), Disp: 10 capsule, Rfl: 0 .  famotidine (PEPCID) 20 MG tablet, Take 1 tablet (20 mg total) by mouth daily as needed for heartburn or indigestion.,  Disp: , Rfl:  .  Flaxseed, Linseed, (FLAXSEED OIL PO), Take by mouth., Disp: , Rfl:  .  fluconazole (DIFLUCAN) 150 MG tablet, Take one tablet by mouth 3 days apart, Disp: 2 tablet, Rfl: 0 .  naproxen sodium (ALEVE) 220 MG tablet, Take 1 tablet (220 mg total) by mouth daily as needed. (Patient taking differently: Take 440 mg by mouth daily as needed. ), Disp: , Rfl:  .  phenazopyridine (PYRIDIUM) 100 MG tablet, Take 1 tablet (100 mg total) by mouth 3 (three) times daily as needed for pain., Disp: 10 tablet, Rfl: 1 .  sulfamethoxazole-trimethoprim (BACTRIM DS) 800-160 MG tablet,  Take 1 tablet by mouth 2 (two) times daily., Disp: 14 tablet, Rfl: 0 .  escitalopram (LEXAPRO) 5 MG tablet, Take 1 tablet (5 mg total) by mouth daily., Disp: 30 tablet, Rfl: 5  PAST MEDICAL HISTORY: Past Medical History:  Diagnosis Date  . Contraceptive management 11/13/2012  . Frequent UTI    after sex  . Hemorrhoids 04/02/2015  . Pain    back  . Rectal fissure 03/30/2014  . Vertigo     PAST SURGICAL HISTORY: Past Surgical History:  Procedure Laterality Date  . CESAREAN SECTION    . COLONOSCOPY     10 years ago - Dr.Fields @ [facility]  . COLONOSCOPY N/A 03/27/2018   Procedure: COLONOSCOPY;  Surgeon: Rogene Houston, MD;  Location: [facility];  Service: Endoscopy;  Laterality: N/A;  100  . POLYPECTOMY  03/27/2018   Procedure: POLYPECTOMY;  Surgeon: Rogene Houston, MD;  Location: [facility];  Service: Endoscopy;;    FAMILY HISTORY: Family History  Problem Relation Age of Onset  . Diabetes Mother   . Hypertension Mother   . Diabetes Father   . Heart disease Father   . Stroke Father   . Kidney failure Father   . Cirrhosis Father   . Hypertension Brother   . Cancer Maternal Aunt        colon,liver  . Cancer Maternal Uncle        lung  . Heart disease Paternal Grandfather   . Alzheimer's disease Paternal Grandmother     SOCIAL HISTORY:  Social History   Socioeconomic History  . Marital status:  Divorced    Spouse name: Not on file  . Number of children: Not on file  . Years of education: Not on file  . Highest education level: Not on file  Occupational History  . Not on file  Social Needs  . Financial resource strain: Not on file  . Food insecurity    Worry: Not on file    Inability: Not on file  . Transportation needs    Medical: Not on file    Non-medical: Not on file  Tobacco Use  . Smoking status: Former Smoker    Types: Cigarettes    Start date: 10/01/1987    Quit date: 10/01/1994    Years since quitting: 24.0  . Smokeless tobacco: Never Used  Substance and Sexual Activity  . Alcohol use: Yes    Comment: occ  . Drug use: No  . Sexual activity: Yes    Birth control/protection: None  Lifestyle  . Physical activity    Days per week: Not on file    Minutes per session: Not on file  . Stress: Not on file  Relationships  . Social Herbalist on phone: Not on file    Gets together: Not on file    Attends religious service: Not on file    Active member of club or organization: Not on file    Attends meetings of clubs or organizations: Not on file    Relationship status: Not on file  . Intimate partner violence    Fear of current or ex partner: Not on file    Emotionally abused: Not on file    Physically abused: Not on file    Forced sexual activity: Not on file  Other Topics Concern  . Not on file  Social History Narrative   Right handed    Caffeine use: coffee every morning     PHYSICAL EXAM  Vitals:   10/30/18 1253  BP: 135/74  Pulse: (!) 59  Temp: 98.4 F (36.9 C)  Weight: 129 lb (58.5 kg)  Height: 5\' 1"  (1.549 m)    Body mass index is 24.37 kg/m.   General: The patient is well-developed and well-nourished and in no acute distress  HEENT:  Head is [place]/AT.    Neck: No carotid bruits are noted.  The neck is nontender.  Cardiovascular: The heart has a regular rate and rhythm with a normal S1 and S2. There were no murmurs,  gallops or rubs.    Skin: Extremities are without rash or  edema.   Neurologic Exam  Mental status: The patient is alert and oriented x 3 at the time of the examination. The patient has apparent normal recent and remote memory, with an apparently normal attention span and concentration ability.   Speech is normal.  Cranial nerves: Extraocular movements are full.    Facial symmetry is present. There is good facial sensation to soft touch bilaterally.Facial strength is normal.  Trapezius and sternocleidomastoid strength is normal. No dysarthria is noted.   . No obvious hearing deficits are noted.  Motor:  Muscle bulk is normal.   Tone is normal. Strength is  5 / 5 in all 4 extremities.    Coordination: Cerebellar testing reveals good finger-nose-finger and heel-to-shin bilaterally.  Gait and station: Station is normal.   Gait is normal. Tandem gait is slightly wide. Romberg is negative.      DIAGNOSTIC DATA (LABS, IMAGING, TESTING) - I reviewed patient records, labs, notes, testing and imaging myself where available.  Lab Results  Component Value Date   WBC 3.8 07/25/2018   HGB 13.2 07/25/2018   HCT 39.4 07/25/2018   MCV 86 07/25/2018   PLT 260 07/25/2018      Component Value Date/Time   NA 143 07/25/2018 0855   K 4.5 07/25/2018 0855   CL 106 07/25/2018 0855   CO2 24 07/25/2018 0855   GLUCOSE 88 07/25/2018 0855   GLUCOSE 99 04/23/2016 1355   BUN 14 07/25/2018 0855   CREATININE 0.79 07/25/2018 0855   CREATININE 0.75 10/15/2015 1031   CALCIUM 10.0 07/25/2018 0855   PROT 7.0 07/25/2018 0855   ALBUMIN 4.6 07/25/2018 0855   AST 28 07/25/2018 0855   ALT 24 07/25/2018 0855   ALKPHOS 82 07/25/2018 0855   BILITOT 0.5 07/25/2018 0855   GFRNONAA 86 07/25/2018 0855   GFRAA 100 07/25/2018 0855   Lab Results  Component Value Date   CHOL 172 05/01/2017   HDL 73 05/01/2017   LDLCALC 81 05/01/2017   TRIG 91 05/01/2017   CHOLHDL 2.4 05/01/2017   Lab Results  Component Value  Date   HGBA1C 5.5 10/15/2015   No results found for: [id number] Lab Results  Component Value Date   TSH 0.997 05/01/2017       ASSESSMENT AND PLAN  1. Other headache syndrome   2. White matter abnormality on MRI of brain   3. Generalized anxiety disorder with panic attacks     1.    Headaches are doing better off med's.   Can take NSAIDs prn 2.    Lexapro 5 mg for GAD/panic attacks.   If not better consider Buspar 3.    MRI had shown mild white matter changes most c/w chronic microvascular ischemic change.  No repeat is necessary unless new symptoms. 4.   She will rtc as needed for new or worsening symptoms    Maddie Brazier  August Saucer, MD, [facility] [id number], [id number] PM Certified in Neurology, Clinical Neurophysiology, Sleep Medicine and Neuroimaging  [facility] [address] [place], [place] [address] [phone]

## 2018-11-21 ENCOUNTER — Other Ambulatory Visit: Payer: Self-pay

## 2018-11-21 DIAGNOSIS — Z20822 Contact with and (suspected) exposure to covid-19: Secondary | ICD-10-CM

## 2018-11-22 LAB — NOVEL CORONAVIRUS, NAA: SARS-CoV-2, NAA: NOT DETECTED

## 2019-04-22 ENCOUNTER — Encounter: Payer: Self-pay | Admitting: Family Medicine

## 2019-04-22 ENCOUNTER — Other Ambulatory Visit: Payer: Self-pay

## 2019-04-22 ENCOUNTER — Ambulatory Visit: Payer: Managed Care, Other (non HMO) | Admitting: Family Medicine

## 2019-04-22 VITALS — BP 118/78 | Temp 95.6°F | Wt 130.4 lb

## 2019-04-22 DIAGNOSIS — M79605 Pain in left leg: Secondary | ICD-10-CM | POA: Diagnosis not present

## 2019-04-22 DIAGNOSIS — Z79899 Other long term (current) drug therapy: Secondary | ICD-10-CM

## 2019-04-22 DIAGNOSIS — W57XXXA Bitten or stung by nonvenomous insect and other nonvenomous arthropods, initial encounter: Secondary | ICD-10-CM

## 2019-04-22 DIAGNOSIS — S70362A Insect bite (nonvenomous), left thigh, initial encounter: Secondary | ICD-10-CM

## 2019-04-22 DIAGNOSIS — Z13220 Encounter for screening for lipoid disorders: Secondary | ICD-10-CM

## 2019-04-22 MED ORDER — DOXYCYCLINE HYCLATE 100 MG PO TABS: ORAL_TABLET | ORAL | 0 refills | Status: DC

## 2019-04-22 MED ORDER — TRIAMCINOLONE ACETONIDE 0.1 % EX CREA: TOPICAL_CREAM | CUTANEOUS | 0 refills | Status: DC

## 2019-04-22 NOTE — Progress Notes (Signed)
   Subjective:    Patient ID: [name] [name], female    DOB: 1965-12-25, 54 y.o.   MRN: [id number] In person visit HPI Pt has tick bite on left inner thigh. Pt states the area started itching on Wednesday. Pt did remove tick and has it with her. Pt states area is red and painful. Pt cleaned area with alcohol and benadryl. Last Friday pt states was feeling really tired.  Significant discomfort and itching where she had the tick bite this was present for at least 4 days when she removed it it was engorged she showed me a picture.  She brought the tick in.  It does appear intact.  She denies fever body aches sweats chills fevers.  The patient also relates that she lost her dad to cirrhosis he was a heavyset guy but did not drink she also lost her aunt to cirrhosis as well Review of Systems    Please see above Objective:   Physical Exam  Lungs clear respiratory rate normal heart regular Left groin region was examined has a small nodule with redness and bruising and irritation nurse present      Assessment & Plan:  Right with associated redness Recommend doxycycline 100 mg twice daily for 10 days Likelihood of underlying RMSF or Lyme's disease is low Triamcinolone as needed for itching   Also possible underlying inherited tendency towards cirrhosis although her dad was a big person in her Aunt also big

## 2019-05-22 ENCOUNTER — Ambulatory Visit
Admission: EM | Admit: 2019-05-22 | Discharge: 2019-05-22 | Disposition: A | Discharge status: Home / Self Care | Payer: Managed Care, Other (non HMO) | Attending: Emergency Medicine | Admitting: Emergency Medicine

## 2019-05-22 DIAGNOSIS — R300 Dysuria: Secondary | ICD-10-CM

## 2019-05-22 DIAGNOSIS — N898 Other specified noninflammatory disorders of vagina: Secondary | ICD-10-CM

## 2019-05-22 LAB — POCT URINALYSIS DIP (MANUAL ENTRY)
Bilirubin, UA: NEGATIVE
Blood, UA: NEGATIVE
Glucose, UA: NEGATIVE mg/dL
Ketones, POC UA: NEGATIVE mg/dL
Nitrite, UA: NEGATIVE
Protein Ur, POC: NEGATIVE mg/dL
Spec Grav, UA: >=1.03 — AB (ref 1.010–1.025)
Urobilinogen, UA: 0.2 E.U./dL
pH, UA: 5 (ref 5.0–8.0)

## 2019-05-22 MED ORDER — PHENAZOPYRIDINE HCL 200 MG PO TABS
200.0000 mg | ORAL_TABLET | Freq: Three times a day (TID) | ORAL | 0 refills | Status: DC
Start: 2019-05-22 — End: 2020-09-27

## 2019-05-22 NOTE — ED Triage Notes (Signed)
Pt presents with c/o urinary frequency and burning for past couple of days

## 2019-05-22 NOTE — Discharge Instructions (Signed)
Urine not concerning for UTI at this time Urine culture sent.  We will call you with abnormal results.   Vaginal swab obtained.  We will call you with abnormal results Push fluids and get plenty of rest.   Take pyridium as prescribed and as needed for symptomatic relief Follow up with PCP if symptoms persists Return here or go to ER if you have any new or worsening symptoms such as fever, worsening abdominal pain, nausea/vomiting, flank pain, etc..Marland Kitchen

## 2019-05-22 NOTE — ED Provider Notes (Signed)
[facility]   CC: Burning with urination  SUBJECTIVE:  [name] [name] is a 54 y.o. female who complains of dysuria, frequency and urgency x few day.  Admits to delayed bathroom breaks.  Mentions low back pain discomfort.  Has NOT tried OTC medications.  Symptoms are made worse with urination.  Admits to similar symptoms in the past with UTI.  Also mentions vaginal irritation.  Denies fever, chills, nausea, vomiting, abdominal pain, flank pain, abnormal vaginal discharge or bleeding, hematuria.    LMP: No LMP recorded. (Menstrual status: Perimenopausal).  ROS: As in HPI.  All other pertinent ROS negative.     Past Medical History:  Diagnosis Date  . Contraceptive management 11/13/2012  . Frequent UTI    after sex  . Hemorrhoids 04/02/2015  . Pain    back  . Rectal fissure 03/30/2014  . Vertigo    Past Surgical History:  Procedure Laterality Date  . CESAREAN SECTION    . COLONOSCOPY     10 years ago - Dr.Fields @ [facility]  . COLONOSCOPY N/A 03/27/2018   Procedure: COLONOSCOPY;  Surgeon: Rogene Houston, MD;  Location[facility];  Service: Endoscopy;  Laterality: N/A;  100  . POLYPECTOMY  03/27/2018   Procedure: POLYPECTOMY;  Surgeon: Rogene Houston, MD;  Location: [facility];  Service: Endoscopy;;   Allergies  Allergen Reactions  . Codeine Nausea And Vomiting  . Keflex [Cephalexin] Swelling  . Penicillins Nausea And Vomiting    Has patient had a PCN reaction causing immediate rash, facial/tongue/throat swelling, SOB or lightheadedness with hypotension: No Has patient had a PCN reaction causing severe rash involving mucus membranes or skin necrosis: No Has patient had a PCN reaction that required hospitalization No Has patient had a PCN reaction occurring within the last 10 years: No If all of the above answers are "NO", then may proceed with Cephalosporin use.   Mack Hook [Levofloxacin In D5w] Swelling and Rash    Tongue, throat swelling, oral rash-patient  has taken Cipro and tolerated this medication. Although the cause it is a similar medicine we try to avoid Cipro   No current facility-administered medications on file prior to encounter.   Current Outpatient Medications on File Prior to Encounter  Medication Sig Dispense Refill  . Ascorbic Acid (VITA-C PO) Take 500 mg by mouth daily.    Marland Kitchen aspirin EC 81 MG tablet Take 1 tablet (81 mg total) by mouth daily.    Marland Kitchen CALCIUM PO Take by mouth.    . doxycycline (VIBRA-TABS) 100 MG tablet Take one tablet po BID for 10 days 20 tablet 0  . escitalopram (LEXAPRO) 5 MG tablet Take 1 tablet (5 mg total) by mouth daily. 30 tablet 5  . triamcinolone cream (KENALOG) 0.1 % Apply thin layer to affected area BID prn itching 15 g 0   Social History   Socioeconomic History  . Marital status: Divorced    Spouse name: Not on file  . Number of children: Not on file  . Years of education: Not on file  . Highest education level: Not on file  Occupational History  . Not on file  Tobacco Use  . Smoking status: Former Smoker    Types: Cigarettes    Start date: 10/01/1987    Quit date: 10/01/1994    Years since quitting: 24.6  . Smokeless tobacco: Never Used  Substance and Sexual Activity  . Alcohol use: Yes    Comment: occ  . Drug use:  No  . Sexual activity: Yes    Birth control/protection: None  Other Topics Concern  . Not on file  Social History Narrative   Right handed    Caffeine use: coffee every morning   Social Determinants of Health   Financial Resource Strain:   . Difficulty of Paying Living Expenses:   Food Insecurity:   . Worried About Charity fundraiser in the Last Year:   . Arboriculturist in the Last Year:   Transportation Needs:   . Film/video editor (Medical):   Marland Kitchen Lack of Transportation (Non-Medical):   Physical Activity:   . Days of Exercise per Week:   . Minutes of Exercise per Session:   Stress:   . Feeling of Stress :   Social Connections:   . Frequency of  Communication with Friends and Family:   . Frequency of Social Gatherings with Friends and Family:   . Attends Religious Services:   . Active Member of Clubs or Organizations:   . Attends Archivist Meetings:   Marland Kitchen Marital Status:   Intimate Partner Violence:   . Fear of Current or Ex-Partner:   . Emotionally Abused:   Marland Kitchen Physically Abused:   . Sexually Abused:    Family History  Problem Relation Age of Onset  . Diabetes Mother   . Hypertension Mother   . Diabetes Father   . Heart disease Father   . Stroke Father   . Kidney failure Father   . Cirrhosis Father   . Hypertension Brother   . Cancer Maternal Aunt        colon,liver  . Cancer Maternal Uncle        lung  . Heart disease Paternal Grandfather   . Alzheimer's disease Paternal Grandmother     OBJECTIVE:  Vitals:   05/22/19 1350  BP: 116/74  Pulse: 68  Resp: 17  Temp: 98.2 F (36.8 C)  TempSrc: Oral  SpO2: 98%   General appearance: Alert in no acute distress HEENT: NCAT.  Oropharynx clear.  Lungs: clear to auscultation bilaterally without adventitious breath sounds Heart: regular rate and rhythm.   Abdomen: soft; non-distended; no tenderness; bowel sounds present; no guarding Back: no CVA tenderness Extremities: no edema; symmetrical with no gross deformities Skin: warm and dry Neurologic: Ambulates from chair to exam table without difficulty Psychological: alert and cooperative; normal mood and affect  Labs Reviewed  POCT URINALYSIS DIP (MANUAL ENTRY) - Abnormal; Notable for the following components:      Result Value   Spec Grav, UA >=1.030 (*)    Leukocytes, UA Trace (*)    All other components within normal limits  URINE CULTURE  CERVICOVAGINAL ANCILLARY ONLY    ASSESSMENT & PLAN:  1. Dysuria   2. Vaginal irritation     Meds ordered this encounter  Medications  . phenazopyridine (PYRIDIUM) 200 MG tablet    Sig: Take 1 tablet (200 mg total) by mouth 3 (three) times daily.     Dispense:  20 tablet    Refill:  0    Order Specific Question:   Supervising Provider    Answer:   Raylene Everts [id number]   Urine not concerning for UTI at this time Urine culture sent.  We will call you with abnormal results.   Vaginal swab obtained.  We will call you with abnormal results Push fluids and get plenty of rest.   Take pyridium as prescribed and as needed for symptomatic relief Follow  up with PCP if symptoms persists Return here or go to ER if you have any new or worsening symptoms such as fever, worsening abdominal pain, nausea/vomiting, flank pain, etc...  Outlined signs and symptoms indicating need for more acute intervention. Patient verbalized understanding. After Visit Summary given.     Lestine Box, PA-C 05/22/19 1434

## 2019-05-23 LAB — CERVICOVAGINAL ANCILLARY ONLY
Bacterial Vaginitis (gardnerella): NEGATIVE
Candida Glabrata: NEGATIVE
Candida Vaginitis: NEGATIVE
Chlamydia: NEGATIVE
Comment: NEGATIVE
Comment: NEGATIVE
Comment: NEGATIVE
Comment: NEGATIVE
Comment: NEGATIVE
Comment: NORMAL
Neisseria Gonorrhea: NEGATIVE
Trichomonas: NEGATIVE

## 2019-05-23 LAB — URINE CULTURE: Culture: <10000 — AB

## 2019-05-30 ENCOUNTER — Ambulatory Visit (HOSPITAL_COMMUNITY)
Admission: RE | Admit: 2019-05-30 | Discharge: 2019-05-30 | Disposition: A | Discharge status: Home / Self Care | Payer: Managed Care, Other (non HMO) | Source: Ambulatory Visit | Attending: Family Medicine | Admitting: Family Medicine

## 2019-05-30 ENCOUNTER — Ambulatory Visit: Payer: Managed Care, Other (non HMO) | Admitting: Family Medicine

## 2019-05-30 ENCOUNTER — Encounter: Payer: Self-pay | Admitting: Family Medicine

## 2019-05-30 ENCOUNTER — Other Ambulatory Visit: Payer: Self-pay

## 2019-05-30 ENCOUNTER — Telehealth: Payer: Self-pay | Admitting: *Deleted

## 2019-05-30 VITALS — BP 128/80 | HR 79 | Temp 97.4°F | Ht 61.0 in | Wt 127.6 lb

## 2019-05-30 DIAGNOSIS — R319 Hematuria, unspecified: Secondary | ICD-10-CM | POA: Diagnosis present

## 2019-05-30 DIAGNOSIS — R1030 Lower abdominal pain, unspecified: Secondary | ICD-10-CM

## 2019-05-30 DIAGNOSIS — R109 Unspecified abdominal pain: Secondary | ICD-10-CM | POA: Insufficient documentation

## 2019-05-30 LAB — POCT URINALYSIS DIPSTICK
Blood, UA: POSITIVE
Spec Grav, UA: <=1.005 — AB (ref 1.010–1.025)
pH, UA: 7 (ref 5.0–8.0)

## 2019-05-30 MED ORDER — HYDROCODONE-ACETAMINOPHEN 5-325 MG PO TABS: 1.0000 | ORAL_TABLET | Freq: Four times a day (QID) | ORAL | 0 refills | Status: DC | PRN

## 2019-05-30 NOTE — Telephone Encounter (Signed)
I called and discussed message sent to me below in a staff message from dr taylor. Pt verbalized understanding of all.   Lovena Le, Malena M, DO  Carmelina Noun, LPN  Pls call pt and let her know I sent in norco (vicodin) to her pharmacy for severe pain if she is trying to pass the kidney stone.   -CT seen 58mm in Rt side near connection from ureter to bladder.  She can take tylenol or ibuprofen for pain as needed if mild to moderate.    per dr Lovena Le -  Severe pain take the norco.  Stone is small enough to pass without intervention ,but needing pain meds to help.   If vomiting, fever, or more severe pain, then needs to go to ER if getting worse tonight or over weekend.   Take care,

## 2019-05-30 NOTE — Progress Notes (Signed)
Patient ID: [name] [name], female    DOB: 06/17/1965, 54 y.o.   MRN: [id number]   Chief Complaint  Patient presents with  . Hematuria   Subjective:    HPI  Pt having concern of blood in urine started today. Pt went to urgent care on 5/6 for dysuria and  frequency. Was told she had trace WBC and dehydration on urine. Having some intermittent flank pain on the right. No fever, chills, dysuria at this time. Has had some pink tinged urine. No n/v/d. No h/o kidney stones. Hasn't seen urology for urinary issues. But has h/o UTIs frequently.   Results for orders placed or performed in visit on 05/30/19  Urine Culture   Specimen: Urine   URINE  Result Value Ref Range   Urine Culture, Routine Final report    Organism ID, Bacteria Comment   POCT urinalysis dipstick  Result Value Ref Range   Color, UA     Clarity, UA     Glucose, UA     Bilirubin, UA     Ketones, UA     Spec Grav, UA <=1.005 (A) 1.010 - 1.025   Blood, UA positive    pH, UA 7.0 5.0 - 8.0   Protein, UA     Urobilinogen, UA     Nitrite, UA     Leukocytes, UA     Appearance     Odor      Medical History Lessie has a past medical history of Contraceptive management (11/13/2012), Frequent UTI, Hemorrhoids (04/02/2015), Pain, Rectal fissure (03/30/2014), and Vertigo.   Outpatient Encounter Medications as of 05/30/2019  Medication Sig  . Ascorbic Acid (VITA-C PO) Take 500 mg by mouth daily.  Marland Kitchen aspirin EC 81 MG tablet Take 1 tablet (81 mg total) by mouth daily.  Marland Kitchen CALCIUM PO Take by mouth.  . escitalopram (LEXAPRO) 5 MG tablet Take 1 tablet (5 mg total) by mouth daily.  . phenazopyridine (PYRIDIUM) 200 MG tablet Take 1 tablet (200 mg total) by mouth 3 (three) times daily.  Marland Kitchen triamcinolone cream (KENALOG) 0.1 % Apply thin layer to affected area BID prn itching  . HYDROcodone-acetaminophen (NORCO) 5-325 MG tablet Take 1 tablet by mouth every 6 (six) hours as needed for moderate pain.  . [DISCONTINUED] doxycycline  (VIBRA-TABS) 100 MG tablet Take one tablet po BID for 10 days   No facility-administered encounter medications on file as of 05/30/2019.     Review of Systems  Constitutional: Negative for chills and fever.  HENT: Negative for congestion, rhinorrhea and sore throat.   Respiratory: Negative for cough, shortness of breath and wheezing.   Cardiovascular: Negative for chest pain and leg swelling.  Gastrointestinal: Negative for abdominal pain, diarrhea, nausea and vomiting.  Genitourinary: Positive for hematuria. Negative for difficulty urinating, dysuria and frequency.  Musculoskeletal: Negative for arthralgias and back pain.  Skin: Negative for rash.  Neurological: Negative for dizziness, weakness and headaches.     Vitals BP 128/80   Pulse 79   Temp (!) 97.4 F (36.3 C)   Ht 5\' 1"  (1.549 m)   Wt 127 lb 9.6 oz (57.9 kg)   BMI 24.11 kg/m   Objective:   Physical Exam Vitals and nursing note reviewed.  Constitutional:      Appearance: Normal appearance.  HENT:     Head: Normocephalic and atraumatic.     Nose: Nose normal.     Mouth/Throat:     Mouth: Mucous membranes are moist.  Pharynx: Oropharynx is clear.  Eyes:     Extraocular Movements: Extraocular movements intact.     Conjunctiva/sclera: Conjunctivae normal.     Pupils: Pupils are equal, round, and reactive to light.  Cardiovascular:     Rate and Rhythm: Normal rate and regular rhythm.     Pulses: Normal pulses.     Heart sounds: Normal heart sounds.  Pulmonary:     Effort: Pulmonary effort is normal.     Breath sounds: Normal breath sounds. No wheezing, rhonchi or rales.  Abdominal:     General: Abdomen is flat. Bowel sounds are normal. There is no distension.     Palpations: Abdomen is soft. There is no mass.     Tenderness: There is no abdominal tenderness. There is no right CVA tenderness, left CVA tenderness, guarding or rebound.     Hernia: No hernia is present.  Musculoskeletal:        General:  Normal range of motion.     Right lower leg: No edema.     Left lower leg: No edema.  Skin:    General: Skin is warm and dry.     Findings: No lesion or rash.  Neurological:     General: No focal deficit present.     Mental Status: She is alert and oriented to person, place, and time.  Psychiatric:        Mood and Affect: Mood normal.        Behavior: Behavior normal.      Assessment and Plan   1. Hematuria, unspecified type - POCT urinalysis dipstick - Urine Culture - POCT Urinalysis Dipstick - CT RENAL STONE STUDY  2. Lower abdominal pain - Urine Culture - POCT Urinalysis Dipstick - CT RENAL STONE STUDY  3. Abdominal pain, unspecified abdominal location - CT RENAL STONE STUDY    Pt sent to get CT scan STAT.  Will call pt with results.  Tylenol/ibuporfen for pain in meantime. Urine sent for culture. UA- showing blood, all other negative.  Handout given on instructions and return precautions.  F/u prn.

## 2019-05-30 NOTE — Patient Instructions (Addendum)
For this weekend, tylenol 500mg  every 6hrs or ibuprofen 800mg  every 8hrs as needed for pain.  If having fever, more pain, or vomiting, need to go to ER.  Obtain the CT abdomen to see if kidney stone.   If it is a stone call back to office for pain medications.   If this is blood in urine then call back office for urology consult again.

## 2019-06-01 LAB — URINE CULTURE

## 2019-06-02 ENCOUNTER — Encounter: Payer: Self-pay | Admitting: Family Medicine

## 2019-06-02 ENCOUNTER — Telehealth: Payer: Self-pay | Admitting: Family Medicine

## 2019-06-02 DIAGNOSIS — N200 Calculus of kidney: Secondary | ICD-10-CM | POA: Insufficient documentation

## 2019-06-02 NOTE — Telephone Encounter (Signed)
Called pt and left VM to call back for status on passing her kidney stone or if having any concerns.   Dr. Lovena Le

## 2019-06-18 ENCOUNTER — Other Ambulatory Visit: Payer: Self-pay | Admitting: Neurology

## 2019-06-27 ENCOUNTER — Telehealth (INDEPENDENT_AMBULATORY_CARE_PROVIDER_SITE_OTHER): Payer: Managed Care, Other (non HMO) | Admitting: Nurse Practitioner

## 2019-06-27 ENCOUNTER — Telehealth: Payer: Self-pay | Admitting: Nurse Practitioner

## 2019-06-27 ENCOUNTER — Other Ambulatory Visit: Payer: Self-pay

## 2019-06-27 ENCOUNTER — Encounter: Payer: Self-pay | Admitting: Nurse Practitioner

## 2019-06-27 ENCOUNTER — Encounter: Payer: Self-pay | Admitting: Family Medicine

## 2019-06-27 DIAGNOSIS — B9689 Other specified bacterial agents as the cause of diseases classified elsewhere: Secondary | ICD-10-CM

## 2019-06-27 DIAGNOSIS — J019 Acute sinusitis, unspecified: Secondary | ICD-10-CM

## 2019-06-27 MED ORDER — AZITHROMYCIN 250 MG PO TABS
ORAL_TABLET | ORAL | 0 refills | Status: DC
Start: 2019-06-27 — End: 2020-09-27

## 2019-06-27 NOTE — Telephone Encounter (Signed)
[name], [name] [name] are scheduled for a virtual visit with your provider today.    Just as we do with appointments in the office, we must obtain your consent to participate.  Your consent will be active for this visit and any virtual visit you may have with one of our providers in the next 365 days.    If you have a MyChart account, I can also send a copy of this consent to you electronically.  All virtual visits are billed to your insurance company just like a traditional visit in the office.  As this is a virtual visit, video technology does not allow for your provider to perform a traditional examination.  This may limit your provider's ability to fully assess your condition.  If your provider identifies any concerns that need to be evaluated in person or the need to arrange testing such as labs, EKG, etc, we will make arrangements to do so.    Although advances in technology are sophisticated, we cannot ensure that it will always work on either your end or our end.  If the connection with a video visit is poor, we may have to switch to a telephone visit.  With either a video or telephone visit, we are not always able to ensure that we have a secure connection.   I need to obtain your verbal consent now.   Are you willing to proceed with your visit today?   [name] [name] has provided verbal consent on 06/27/2019 for a virtual visit (video or telephone).   Vicente Males, LPN 7/84/1282  08:13 AM

## 2019-06-27 NOTE — Progress Notes (Signed)
  VIDEO VISIT Subjective:    Patient ID: [name] [name], female    DOB: 25-Sep-1965, 54 y.o.   MRN: [id number]  HPI Pt is having congestion, runny nose and at times stopped up nose. Pt states one minute her nose is stopped up and the next, her nose is runny. Pt has tried Sudafed, Mucinex and some Doxycyline that she had. No relief.  Pt will also need a work note. Pt would like to come by office and pick it up.  Virtual Visit via Video Note  I connected with [name] [name] on 06/27/19 at 11:20 AM EDT by a video enabled telemedicine application and verified that I am speaking with the correct person using two identifiers.  Location: Patient: home Provider: office   I discussed the limitations of evaluation and management by telemedicine and the availability of in person appointments. The patient expressed understanding and agreed to proceed.  History of Present Illness: See above.  Complaints of clear runny nose.  Occasional pressure and headache especially over the left.  No ear pain but some popping.  No sore throat.  No fever.  No wheezing.  Occasional cough due to a tickle in her throat.  No sore throat.  Has not had any Covid testing, no known contacts.  Has tried Sudafed and Mucinex.  Took 4-5 doses of doxycycline this week with no improvement.   Observations/Objective: Format  Patient present at home Provider present at office Consent for interaction obtained Coronavirus outbreak made virtual visit necessary  NAD.  Alert, oriented.  Obvious head congestion noted.  Fatigued in appearance.  Color normal.  Assessment and Plan: Problem List Items Addressed This Visit    None    Visit Diagnoses    Acute bacterial rhinosinusitis    -  Primary   Relevant Medications   azithromycin (ZITHROMAX Z-PAK) 250 MG tablet     Meds ordered this encounter  Medications  . azithromycin (ZITHROMAX Z-PAK) 250 MG tablet    Sig: Take 2 tablets (500 mg) on  Day 1,  followed by 1 tablet (250 mg)  once daily on Days 2 through 5.    Dispense:  6 each    Refill:  0    Order Specific Question:   Supervising Provider    Answer:   Sallee Lange A [[id number]]     Follow Up Instructions: Continue current OTC meds as directed for congestion. Warning signs reviewed. Call back next week if no improvement, sooner if worse.   I discussed the assessment and treatment plan with the patient. The patient was provided an opportunity to ask questions and all were answered. The patient agreed with the plan and demonstrated an understanding of the instructions.   The patient was advised to call back or seek an in-person evaluation if the symptoms worsen or if the condition fails to improve as anticipated.  I provided 15 minutes of non-face-to-face time during this encounter.       Review of Systems     Objective:   Physical Exam        Assessment & Plan:

## 2019-07-16 ENCOUNTER — Other Ambulatory Visit (HOSPITAL_COMMUNITY): Payer: Self-pay | Admitting: Internal Medicine

## 2019-07-16 DIAGNOSIS — Z1231 Encounter for screening mammogram for malignant neoplasm of breast: Secondary | ICD-10-CM

## 2019-09-02 ENCOUNTER — Other Ambulatory Visit (HOSPITAL_COMMUNITY): Payer: Self-pay | Admitting: Internal Medicine

## 2019-09-02 ENCOUNTER — Other Ambulatory Visit: Payer: Self-pay | Admitting: Internal Medicine

## 2019-09-02 DIAGNOSIS — N202 Calculus of kidney with calculus of ureter: Secondary | ICD-10-CM

## 2019-09-11 ENCOUNTER — Other Ambulatory Visit: Payer: Self-pay

## 2019-09-11 ENCOUNTER — Ambulatory Visit (HOSPITAL_COMMUNITY)
Admission: RE | Admit: 2019-09-11 | Discharge: 2019-09-11 | Disposition: A | Discharge status: Home / Self Care | Payer: Managed Care, Other (non HMO) | Source: Ambulatory Visit | Attending: Internal Medicine | Admitting: Internal Medicine

## 2019-09-11 DIAGNOSIS — N202 Calculus of kidney with calculus of ureter: Secondary | ICD-10-CM | POA: Insufficient documentation

## 2019-10-23 ENCOUNTER — Ambulatory Visit: Payer: Managed Care, Other (non HMO) | Admitting: Urology

## 2020-04-15 ENCOUNTER — Other Ambulatory Visit: Payer: Self-pay

## 2020-04-15 ENCOUNTER — Ambulatory Visit: Payer: Managed Care, Other (non HMO) | Admitting: Podiatry

## 2020-04-15 ENCOUNTER — Encounter: Payer: Self-pay | Admitting: Podiatry

## 2020-04-15 ENCOUNTER — Ambulatory Visit (INDEPENDENT_AMBULATORY_CARE_PROVIDER_SITE_OTHER): Payer: Managed Care, Other (non HMO)

## 2020-04-15 DIAGNOSIS — M21611 Bunion of right foot: Secondary | ICD-10-CM

## 2020-04-15 DIAGNOSIS — M79672 Pain in left foot: Secondary | ICD-10-CM

## 2020-04-15 DIAGNOSIS — M79671 Pain in right foot: Secondary | ICD-10-CM | POA: Diagnosis not present

## 2020-04-15 DIAGNOSIS — M722 Plantar fascial fibromatosis: Secondary | ICD-10-CM | POA: Diagnosis not present

## 2020-04-15 MED ORDER — TRIAMCINOLONE ACETONIDE 10 MG/ML IJ SUSP
10.0000 mg | Freq: Once | INTRAMUSCULAR | Status: AC
  Administered 2020-04-15: 10 mg

## 2020-04-15 MED ORDER — DICLOFENAC SODIUM 75 MG PO TBEC
75.0000 mg | DELAYED_RELEASE_TABLET | Freq: Two times a day (BID) | ORAL | 2 refills | Status: DC
Start: 2020-04-15 — End: 2020-09-27

## 2020-04-15 NOTE — Progress Notes (Signed)
Subjective:   Patient ID: [name] [name], female   DOB: 55 y.o.   MRN: [id number]   HPI Patient presents stating she is developed a lot of pain in the bottom of both heels and states it is been present for a number of months.  Does not remember specific injury needs to be active on her feet and states it is become more bothersome for her over time.  Patient does work on [employer] and does not smoke likes to be active   Review of Systems  All other systems reviewed and are negative.       Objective:  Physical Exam Vitals and nursing note reviewed.  Constitutional:      Appearance: She is well-developed.  Pulmonary:     Effort: Pulmonary effort is normal.  Musculoskeletal:        General: Normal range of motion.  Skin:    General: Skin is warm.  Neurological:     Mental Status: She is alert.     Neurovascular status intact muscle strength was found to be adequate range of motion adequate.  Patient is found to have exquisite discomfort plantar aspect heel region bilateral inflammation fluid around the medial band with good digital perfusion well oriented x3     Assessment:  Acute plantar fasciitis bilateral heel with inflammation fluid buildup around the medial band     Plan:  H&P x-rays reviewed sterile prep and injected the plantar fascial bilateral 3 mg Dexasone Kenalog 5 mg Xylocaine applied fascial brace bilateral gave instructions for physical therapy support therapy and placed on diclofenac 75 mg twice daily.  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

## 2020-04-15 NOTE — Progress Notes (Signed)
dg 

## 2020-04-15 NOTE — Patient Instructions (Signed)
Bunion A bunion (hallux valgus) is a bump that forms slowly on the inner side of the big toe joint. It occurs when the big toe turns toward the second toe. Bunions may be small at first, but they often get larger over time. They can make walking painful. What are the causes? This condition may be caused by:  Wearing narrow or pointed shoes that force the big toe to press against the other toes.  Abnormal foot development that causes the foot to roll inward.  Changes in the foot that are caused by certain diseases, such as rheumatoid arthritis or polio.  A foot injury. What increases the risk? The following factors may make you more likely to develop this condition:  Wearing shoes that squeeze the toes together.  Having certain diseases, such as: ? Rheumatoid arthritis. ? Polio. ? Cerebral palsy.  Having family members who have bunions.  Being born with abnormally shaped feet (a foot deformity), such as flat feet or low arches.  Doing activities that put a lot of pressure on the feet, such as ballet dancing. What are the signs or symptoms? The main symptom of this condition is a bump on your big toe that you can notice. Other symptoms may include:  Pain.  Redness and inflammation around your big toe.  Thick or hardened skin on your big toe or between your toes.  Stiffness or loss of motion in your big toe.  Trouble with walking.   How is this diagnosed? This condition may be diagnosed based on your symptoms, medical history, and activities. You may also have tests and imaging, such as:  X-rays. These allow your health care provider to check the position of the bones in your foot and look for damage to your joint. They also help your health care provider determine the severity of your bunion and the best way to treat it.  Joint aspiration. In this test, a sample of fluid is removed from the toe joint. This test may be done if you are in a lot of pain. It helps rule out  diseases that cause painful swelling of the joints, such as arthritis or gout. How is this treated? Treatment depends on the severity of your symptoms. The goal of treatment is to relieve symptoms and prevent your bunion from getting worse. Your health care provider may recommend:  Wearing shoes that have a wide toe box, or using bunion pads to cushion the affected area.  Taping your toes together to keep them in a normal position.  Placing a device inside your shoe (orthotic device) to help reduce pressure on your toe joint.  Taking medicine to ease pain and inflammation.  Putting ice or heat on the affected area.  Doing stretching exercises.  Surgery, for severe cases. Follow these instructions at home: Managing pain, stiffness, and swelling  If directed, put ice on the painful area. To do this: ? Put ice in a plastic bag. ? Place a towel between your skin and the bag. ? Leave the ice on for 20 minutes, 2-3 times a day. ? Remove the ice if your skin turns bright red. This is very important. If you cannot feel pain, heat, or cold, you have a greater risk of damage to the area.  If directed, apply heat to the affected area before you exercise. Use the heat source that your health care provider recommends, such as a moist heat pack or a heating pad. ? Place a towel between your skin and the   heat source. ? Leave the heat on for 20-30 minutes. ? Remove the heat if your skin turns bright red. This is especially important if you are unable to feel pain, heat, or cold. You have a greater risk of getting burned.      General instructions  Do exercises as told by your health care provider.  Support your toe joint with proper footwear, shoe padding, or taping as told by your health care provider.  Take over-the-counter and prescription medicines only as told by your health care provider.  Do not use any products that contain nicotine or tobacco, such as cigarettes, e-cigarettes, and  chewing tobacco. If you need help quitting, ask your health care provider.  Keep all follow-up visits. This is important. Contact a health care provider if:  Your symptoms get worse.  Your symptoms do not improve in 2 weeks. Get help right away if:  You have severe pain and trouble with walking. Summary  A bunion is a bump on the inner side of the big toe joint that forms when the big toe turns toward the second toe.  Bunions can make walking painful.  Treatment depends on the severity of your symptoms.  Support your toe joint with proper footwear, shoe padding, or taping as told by your health care provider. This information is not intended to replace advice given to you by your health care provider. Make sure you discuss any questions you have with your health care provider. Document Revised: 05/09/2019 Document Reviewed: 05/09/2019 Elsevier Patient Education  2021 [place]. Plantar Fasciitis (Heel Spur Syndrome) with Rehab The plantar fascia is a fibrous, ligament-like, soft-tissue structure that spans the bottom of the foot. Plantar fasciitis is a condition that causes pain in the foot due to inflammation of the tissue. SYMPTOMS   Pain and tenderness on the underneath side of the foot.  Pain that worsens with standing or walking. CAUSES  Plantar fasciitis is caused by irritation and injury to the plantar fascia on the underneath side of the foot. Common mechanisms of injury include:  Direct trauma to bottom of the foot.  Damage to a small nerve that runs under the foot where the main fascia attaches to the heel bone.  Stress placed on the plantar fascia due to bone spurs. RISK INCREASES WITH:   Activities that place stress on the plantar fascia (running, jumping, pivoting, or cutting).  Poor strength and flexibility.  Improperly fitted shoes.  Tight calf muscles.  Flat feet.  Failure to warm-up properly before activity.  Obesity. PREVENTION  Warm up  and stretch properly before activity.  Allow for adequate recovery between workouts.  Maintain physical fitness:  Strength, flexibility, and endurance.  Cardiovascular fitness.  Maintain a health body weight.  Avoid stress on the plantar fascia.  Wear properly fitted shoes, including arch supports for individuals who have flat feet.  PROGNOSIS  If treated properly, then the symptoms of plantar fasciitis usually resolve without surgery. However, occasionally surgery is necessary.  RELATED COMPLICATIONS   Recurrent symptoms that may result in a chronic condition.  Problems of the lower back that are caused by compensating for the injury, such as limping.  Pain or weakness of the foot during push-off following surgery.  Chronic inflammation, scarring, and partial or complete fascia tear, occurring more often from repeated injections.  TREATMENT  Treatment initially involves the use of ice and medication to help reduce pain and inflammation. The use of strengthening and stretching exercises may help reduce pain with  activity, especially stretches of the Achilles tendon. These exercises may be performed at home or with a therapist. Your caregiver may recommend that you use heel cups of arch supports to help reduce stress on the plantar fascia. Occasionally, corticosteroid injections are given to reduce inflammation. If symptoms persist for greater than 6 months despite non-surgical (conservative), then surgery may be recommended.   MEDICATION   If pain medication is necessary, then nonsteroidal anti-inflammatory medications, such as aspirin and ibuprofen, or other minor pain relievers, such as acetaminophen, are often recommended.  Do not take pain medication within 7 days before surgery.  Prescription pain relievers may be given if deemed necessary by your caregiver. Use only as directed and only as much as you need.  Corticosteroid injections may be given by your caregiver. These  injections should be reserved for the most serious cases, because they may only be given a certain number of times.  HEAT AND COLD  Cold treatment (icing) relieves pain and reduces inflammation. Cold treatment should be applied for 10 to 15 minutes every 2 to 3 hours for inflammation and pain and immediately after any activity that aggravates your symptoms. Use ice packs or massage the area with a piece of ice (ice massage).  Heat treatment may be used prior to performing the stretching and strengthening activities prescribed by your caregiver, physical therapist, or athletic trainer. Use a heat pack or soak the injury in warm water.  SEEK IMMEDIATE MEDICAL CARE IF:  Treatment seems to offer no benefit, or the condition worsens.  Any medications produce adverse side effects.  EXERCISES- RANGE OF MOTION (ROM) AND STRETCHING EXERCISES - Plantar Fasciitis (Heel Spur Syndrome) These exercises may help you when beginning to rehabilitate your injury. Your symptoms may resolve with or without further involvement from your physician, physical therapist or athletic trainer. While completing these exercises, remember:   Restoring tissue flexibility helps normal motion to return to the joints. This allows healthier, less painful movement and activity.  An effective stretch should be held for at least 30 seconds.  A stretch should never be painful. You should only feel a gentle lengthening or release in the stretched tissue.  RANGE OF MOTION - Toe Extension, Flexion  Sit with your right / left leg crossed over your opposite knee.  Grasp your toes and gently pull them back toward the top of your foot. You should feel a stretch on the bottom of your toes and/or foot.  Hold this stretch for 10 seconds.  Now, gently pull your toes toward the bottom of your foot. You should feel a stretch on the top of your toes and or foot.  Hold this stretch for 10 seconds. Repeat  times. Complete this stretch 3  times per day.   RANGE OF MOTION - Ankle Dorsiflexion, Active Assisted  Remove shoes and sit on a chair that is preferably not on a carpeted surface.  Place right / left foot under knee. Extend your opposite leg for support.  Keeping your heel down, slide your right / left foot back toward the chair until you feel a stretch at your ankle or calf. If you do not feel a stretch, slide your bottom forward to the edge of the chair, while still keeping your heel down.  Hold this stretch for 10 seconds. Repeat 3 times. Complete this stretch 2 times per day.   STRETCH  Gastroc, Standing  Place hands on wall.  Extend right / left leg, keeping the front knee somewhat bent.  Slightly point your toes inward on your back foot.  Keeping your right / left heel on the floor and your knee straight, shift your weight toward the wall, not allowing your back to arch.  You should feel a gentle stretch in the right / left calf. Hold this position for 10 seconds. Repeat 3 times. Complete this stretch 2 times per day.  STRETCH  Soleus, Standing  Place hands on wall.  Extend right / left leg, keeping the other knee somewhat bent.  Slightly point your toes inward on your back foot.  Keep your right / left heel on the floor, bend your back knee, and slightly shift your weight over the back leg so that you feel a gentle stretch deep in your back calf.  Hold this position for 10 seconds. Repeat 3 times. Complete this stretch 2 times per day.  STRETCH  Gastrocsoleus, Standing  Note: This exercise can place a lot of stress on your foot and ankle. Please complete this exercise only if specifically instructed by your caregiver.   Place the ball of your right / left foot on a step, keeping your other foot firmly on the same step.  Hold on to the wall or a rail for balance.  Slowly lift your other foot, allowing your body weight to press your heel down over the edge of the step.  You should feel a  stretch in your right / left calf.  Hold this position for 10 seconds.  Repeat this exercise with a slight bend in your right / left knee. Repeat 3 times. Complete this stretch 2 times per day.   STRENGTHENING EXERCISES - Plantar Fasciitis (Heel Spur Syndrome)  These exercises may help you when beginning to rehabilitate your injury. They may resolve your symptoms with or without further involvement from your physician, physical therapist or athletic trainer. While completing these exercises, remember:   Muscles can gain both the endurance and the strength needed for everyday activities through controlled exercises.  Complete these exercises as instructed by your physician, physical therapist or athletic trainer. Progress the resistance and repetitions only as guided.  STRENGTH - Towel Curls  Sit in a chair positioned on a non-carpeted surface.  Place your foot on a towel, keeping your heel on the floor.  Pull the towel toward your heel by only curling your toes. Keep your heel on the floor. Repeat 3 times. Complete this exercise 2 times per day.  STRENGTH - Ankle Inversion  Secure one end of a rubber exercise band/tubing to a fixed object (table, pole). Loop the other end around your foot just before your toes.  Place your fists between your knees. This will focus your strengthening at your ankle.  Slowly, pull your big toe up and in, making sure the band/tubing is positioned to resist the entire motion.  Hold this position for 10 seconds.  Have your muscles resist the band/tubing as it slowly pulls your foot back to the starting position. Repeat 3 times. Complete this exercises 2 times per day.  Document Released: 01/02/2005 Document Revised: 03/27/2011 Document Reviewed: 04/16/2008 [facility] Patient Information 2014 [place], [place].

## 2020-04-29 ENCOUNTER — Ambulatory Visit: Payer: Managed Care, Other (non HMO) | Admitting: Podiatry

## 2020-05-26 ENCOUNTER — Other Ambulatory Visit: Payer: Self-pay

## 2020-05-26 ENCOUNTER — Ambulatory Visit: Payer: Managed Care, Other (non HMO) | Admitting: Podiatry

## 2020-05-26 ENCOUNTER — Encounter: Payer: Self-pay | Admitting: Podiatry

## 2020-05-26 DIAGNOSIS — M21611 Bunion of right foot: Secondary | ICD-10-CM

## 2020-05-26 DIAGNOSIS — M722 Plantar fascial fibromatosis: Secondary | ICD-10-CM

## 2020-05-26 MED ORDER — TRIAMCINOLONE ACETONIDE 10 MG/ML IJ SUSP: 10.0000 mg | Freq: Once | INTRAMUSCULAR | Status: DC

## 2020-05-26 MED ORDER — TRIAMCINOLONE ACETONIDE 10 MG/ML IJ SUSP
20.0000 mg | Freq: Once | INTRAMUSCULAR | Status: AC
  Administered 2020-05-26: 20 mg

## 2020-05-26 MED ORDER — PREDNISONE 10 MG PO TABS
ORAL_TABLET | ORAL | 0 refills | Status: DC
Start: 2020-05-26 — End: 2020-09-27

## 2020-05-26 NOTE — Progress Notes (Signed)
Subjective:   Patient ID: [name] [name], female   DOB: 55 y.o.   MRN: [id number]   HPI Patient presents with severe pain in the plantar fascial bilateral is becoming more difficult to handle.  States its been there for years but is worsened over the last 4 to 6 months and did not respond to previous treatment that we attempted neurovascular   ROS      Objective:  Physical Exam  Status intact with exquisite discomfort in the plantar fascia at insertion calcaneus bilateral negative for equinus currently     Assessment:  Acute Planter fasciitis that is becoming more difficult for her bilateral     Plan:  H&P reviewed condition at great length discussed different treatment options and the difficulty of treating this given her long length of time and her acute manifestations.  Today I did go ahead I did sterile prep I reinjected the plantar fascia 3 mg Kenalog 5 mg Xylocaine and I went ahead and I applied night splint with all instructions on usage and casted for functional orthotics to try to reduce the stress on her feet.  Understands ultimate surgical intervention may be necessary

## 2020-07-12 ENCOUNTER — Other Ambulatory Visit: Payer: Managed Care, Other (non HMO)

## 2020-07-13 ENCOUNTER — Other Ambulatory Visit: Payer: Self-pay

## 2020-07-13 ENCOUNTER — Other Ambulatory Visit: Payer: Managed Care, Other (non HMO)

## 2020-09-27 ENCOUNTER — Other Ambulatory Visit: Payer: Self-pay

## 2020-09-27 ENCOUNTER — Ambulatory Visit: Payer: Managed Care, Other (non HMO) | Admitting: Adult Health

## 2020-09-27 ENCOUNTER — Other Ambulatory Visit (HOSPITAL_COMMUNITY)
Admission: RE | Admit: 2020-09-27 | Discharge: 2020-09-27 | Disposition: A | Discharge status: Home / Self Care | Payer: Managed Care, Other (non HMO) | Source: Ambulatory Visit | Attending: Adult Health | Admitting: Adult Health

## 2020-09-27 ENCOUNTER — Encounter: Payer: Self-pay | Admitting: Adult Health

## 2020-09-27 VITALS — BP 107/71 | HR 73 | Ht 61.0 in | Wt 118.0 lb

## 2020-09-27 DIAGNOSIS — Z1231 Encounter for screening mammogram for malignant neoplasm of breast: Secondary | ICD-10-CM | POA: Insufficient documentation

## 2020-09-27 DIAGNOSIS — R232 Flushing: Secondary | ICD-10-CM

## 2020-09-27 DIAGNOSIS — N941 Unspecified dyspareunia: Secondary | ICD-10-CM

## 2020-09-27 DIAGNOSIS — Z124 Encounter for screening for malignant neoplasm of cervix: Secondary | ICD-10-CM

## 2020-09-27 DIAGNOSIS — N952 Postmenopausal atrophic vaginitis: Secondary | ICD-10-CM | POA: Diagnosis not present

## 2020-09-27 DIAGNOSIS — Z780 Asymptomatic menopausal state: Secondary | ICD-10-CM | POA: Insufficient documentation

## 2020-09-27 MED ORDER — ESTRADIOL 0.1 MG/GM VA CREA
TOPICAL_CREAM | VAGINAL | 3 refills | Status: DC
Start: 2020-09-27 — End: 2022-05-04

## 2020-09-27 NOTE — Progress Notes (Signed)
  Subjective:     Patient ID: [name] [name], female   DOB: [birth year], 55 y.o.   MRN: [id number]  HPI [name] is a 55 year old white female,engaged, PM in complaining of vaginal dryness and pain with sex, last pap 2019. She still works at [employer] in [place].  PCP is Dr Nevada Crane.  Review of Systems Vaginal dryness Pain with sex Some hot flashes Reviewed past medical,surgical, social and family history. Reviewed medications and allergies.     Objective:   Physical Exam BP 107/71 (BP Location: Left Arm, Patient Position: Sitting, Cuff Size: Normal)   Pulse 73   Ht '5\' 1"'$  (1.549 m)   Wt 118 lb (53.5 kg)   LMP 12/03/2017 (Approximate)   BMI 22.30 kg/m  Skin warm and dry. Lungs: clear to ausculation bilaterally. Cardiovascular: regular rate and rhythm.    Pelvic: external genitalia is normal in appearance no lesions, vagina: pale,flat and dry,urethra has no lesions or masses noted, cervix:atrophic, pap with HR HPV performed, uterus: normal size, shape and contour, non tender, no masses felt, adnexa: no masses or tenderness noted. Bladder is non tender and no masses felt.  Fall risk is low  Upstream - 09/27/20 1112       Pregnancy Intention Screening   Does the patient want to become pregnant in the next year? No    Does the patient's partner want to become pregnant in the next year? No    Would the patient like to discuss contraceptive options today? No      Contraception Wrap Up   Current Method No Method - Other Reason   postmenopausal   End Method No Method - Other Reason   postmenopausal   Contraception Counseling Provided No               Examination chaperoned by Levy Pupa LPN   Assessment:     1. Routine cervical smear Pap sent  - Cytology - PAP( [facility])  2. Vaginal atrophy Will try estrace vaginal cream Meds ordered this encounter  Medications   estradiol (ESTRACE VAGINAL) 0.1 MG/GM vaginal cream    Sig: Use 1 gm daily in vagina for 2 weeks then 3 x  weekly    Dispense:  42.5 g    Refill:  3    Order Specific Question:   Supervising Provider    Answer:   EURE, LUTHER H [[id number]]     3. Dyspareunia in female Will try estrace vaginal cream  4. Hot flashes  5. Postmenopause   6. Screening mammogram for breast cancer Pt to call for appt - MM 3D SCREEN BREAST BILATERAL; Future     Plan:     Follow up in 4 weeks to recheck vaginal tissues

## 2020-10-04 LAB — CYTOLOGY - PAP
Comment: NEGATIVE
Diagnosis: NEGATIVE
High risk HPV: NEGATIVE

## 2020-10-25 ENCOUNTER — Ambulatory Visit: Payer: Managed Care, Other (non HMO) | Admitting: Adult Health

## 2021-01-27 IMAGING — CT CT RENAL STONE PROTOCOL
2 of 4 series · 17 of 46 positions shown, 19 images · non-contrast
Comparison: CT abdomen pelvis dated December 13, 2004.

CLINICAL DATA: Right-sided flank pain for the past 2 weeks with
hematuria.

EXAM:
CT ABDOMEN AND PELVIS WITHOUT CONTRAST
TECHNIQUE: Multidetector CT imaging of the abdomen and pelvis was performed
following the standard protocol without IV contrast.

[Series 2: axial st · axial · 0.84mm/px · z∈[+880,+1275]mm · 14 of 91 slices shown, 16 images]
[im 6/91  soft-tissue]
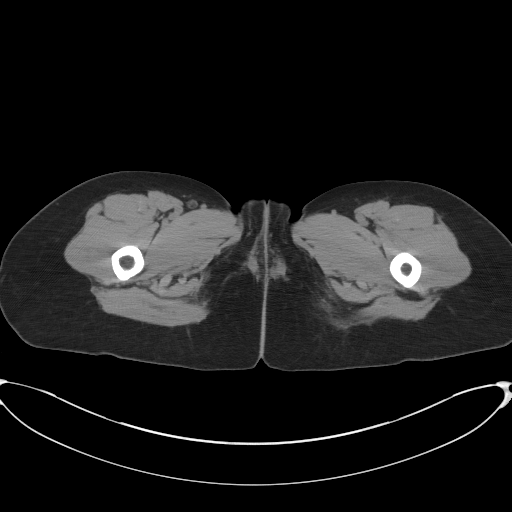
[im 6/91  bone]
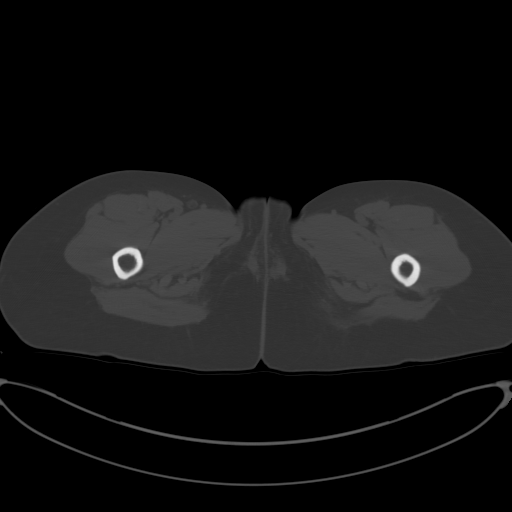
[im 11/91  soft-tissue]
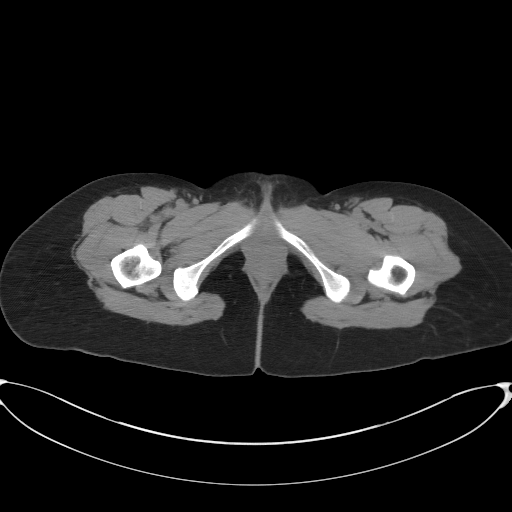
[im 16/91  soft-tissue]
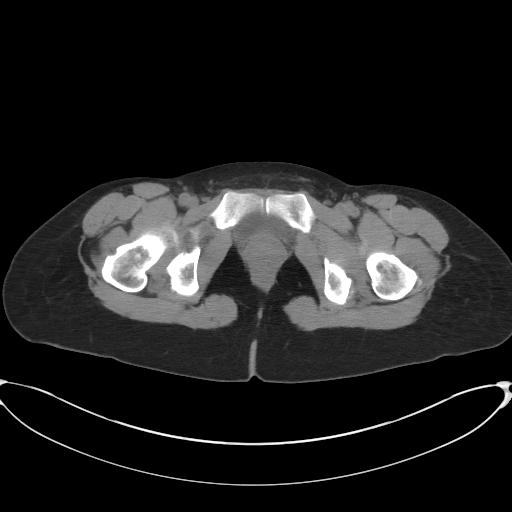
[im 27/91  soft-tissue]
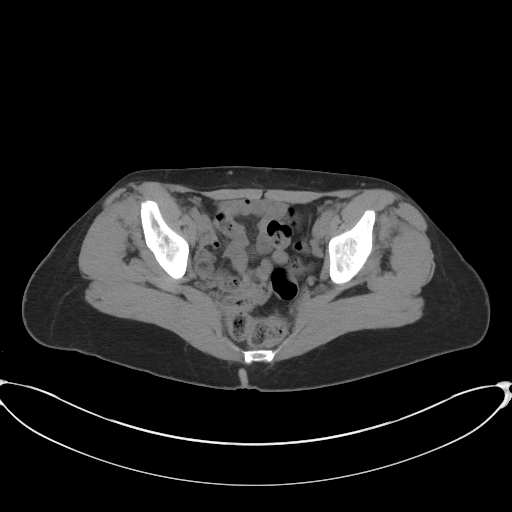
[im 32/91  soft-tissue]
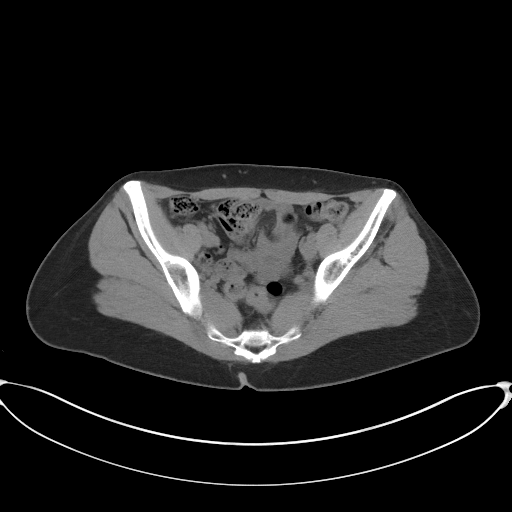
[im 38/91  soft-tissue]
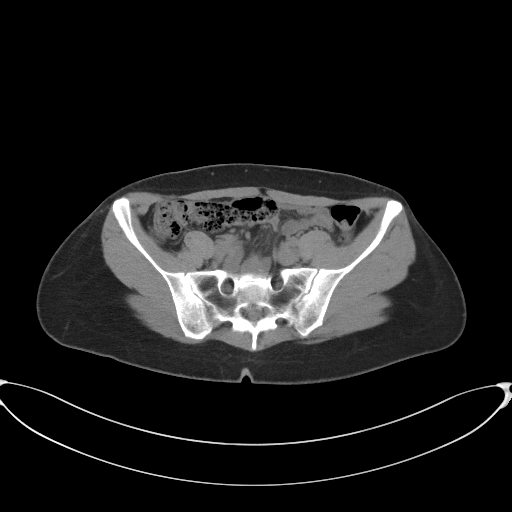
[im 43/91  soft-tissue]
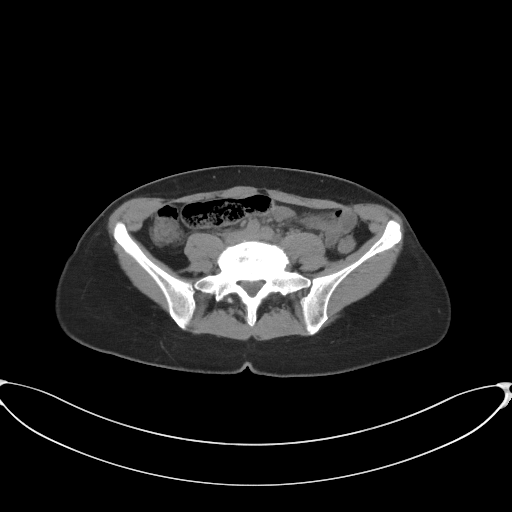
[im 48/91  soft-tissue]
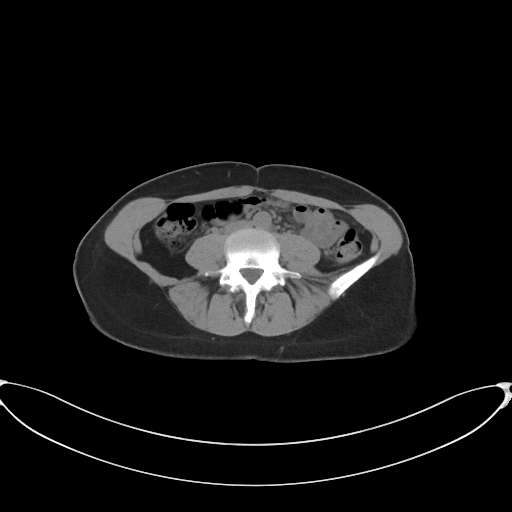
[im 53/91  soft-tissue]
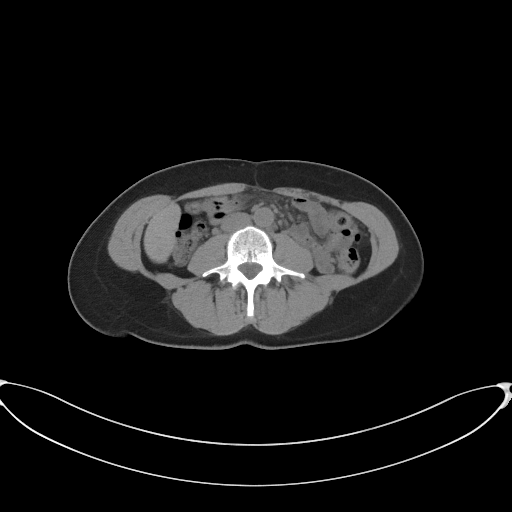
[im 53/91  bone]
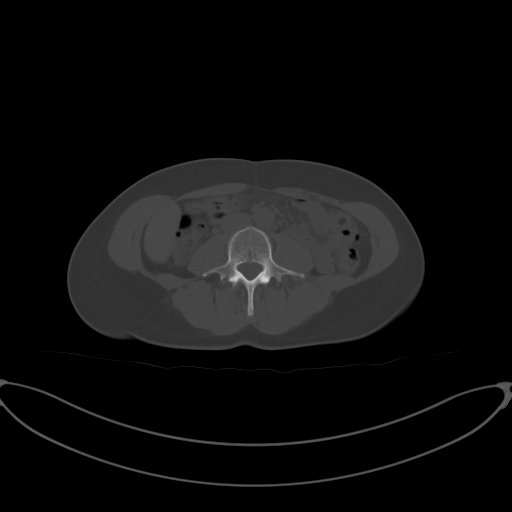
[im 59/91  soft-tissue]
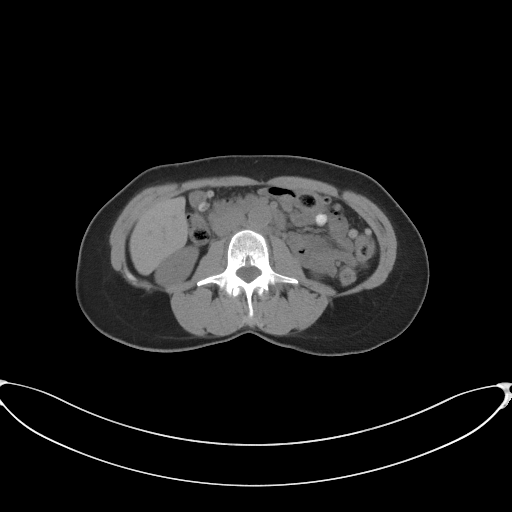
[im 69/91  soft-tissue]
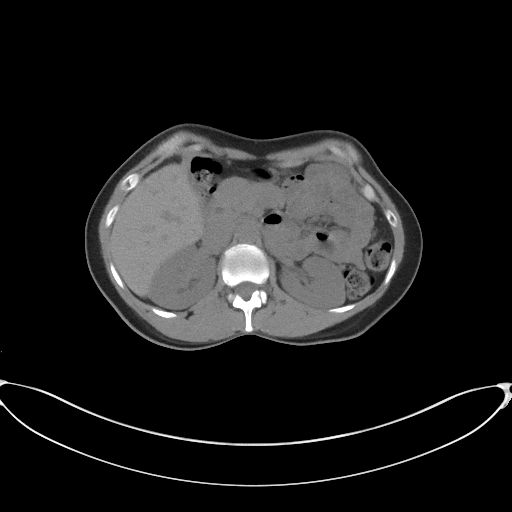
[im 75/91  soft-tissue]
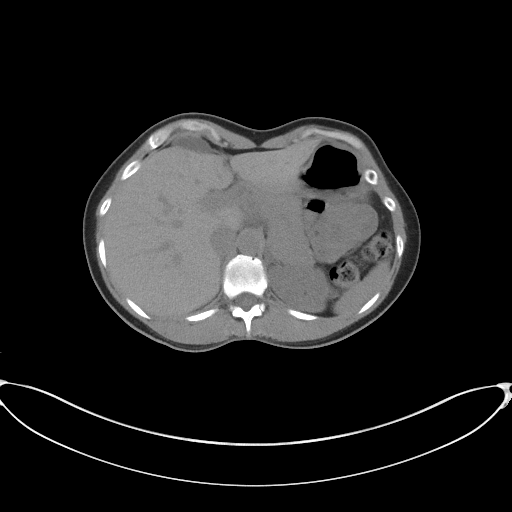
[im 80/91  soft-tissue]
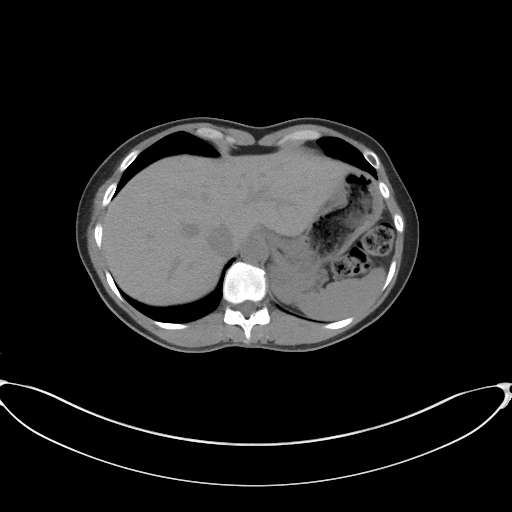
[im 85/91  soft-tissue]
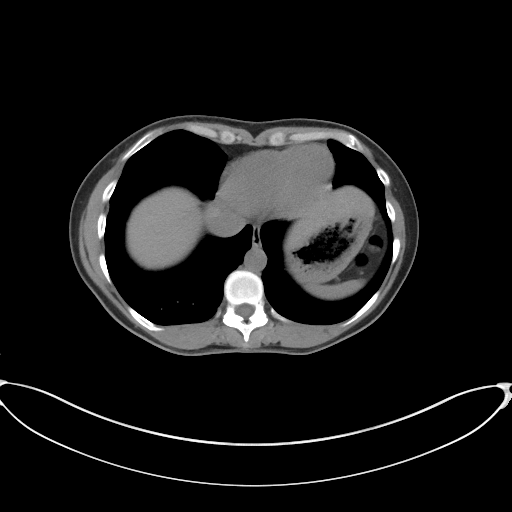

[Series 5: coronal st · coronal · 0.89mm/px · 3 of 84 slices shown]
[im 28/84  soft-tissue]
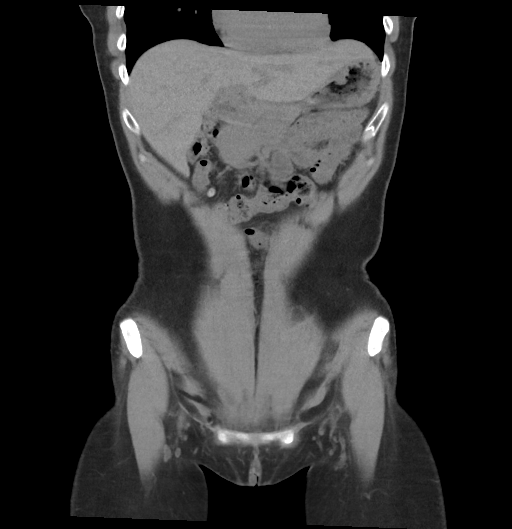
[im 37/84  soft-tissue]
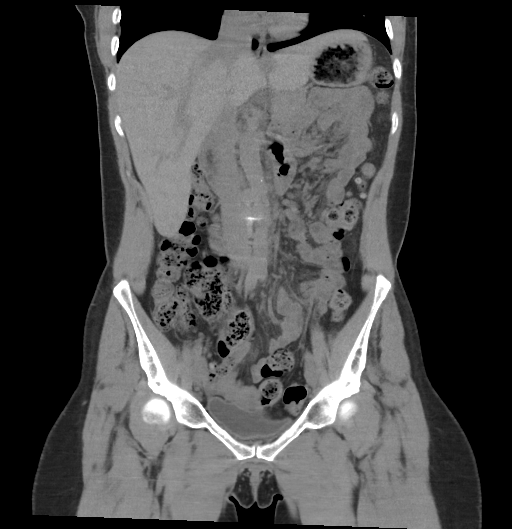
[im 47/84  soft-tissue]
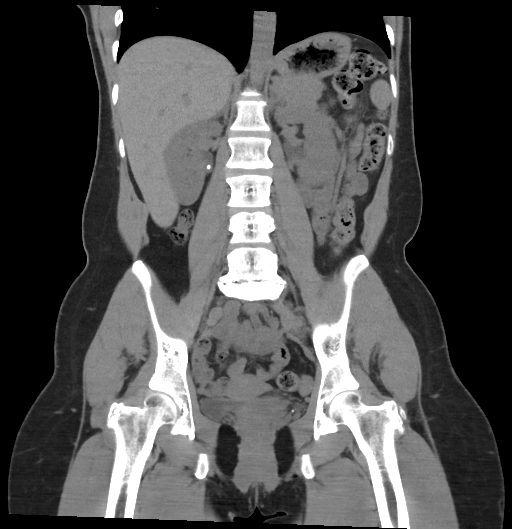

[17 of 46 positions shown; findings below may reference images not displayed]

FINDINGS: Lower chest: No acute abnormality.

Hepatobiliary: No focal liver abnormality is seen. No gallstones,
gallbladder wall thickening, or biliary dilatation.

Pancreas: Unremarkable. No pancreatic ductal dilatation or
surrounding inflammatory changes.

Spleen: Normal in size without focal abnormality.

Adrenals/Urinary Tract: The adrenal glands are unremarkable. 3 mm
calculus at the right UPJ. No hydronephrosis. Tiny focus of air in
the dependent bladder potentially related to recent instrumentation.
The bladder is otherwise unremarkable for the degree of distention.

Stomach/Bowel: Stomach is within normal limits. Appendix appears
normal. No evidence of bowel wall thickening, distention, or
inflammatory changes. Colonic diverticulosis.

Vascular/Lymphatic: No significant vascular findings are present. No
enlarged abdominal or pelvic lymph nodes.

Reproductive: Uterus and bilateral adnexa are unremarkable.

Other: No abdominal wall hernia or abnormality. No abdominopelvic
ascites. No pneumoperitoneum.

Musculoskeletal: No acute or significant osseous findings.
IMPRESSION: 1. 3 mm calculus at the right UPJ. No hydronephrosis.

## 2021-05-11 IMAGING — US US RENAL
1 series · 14 of 25 positions shown · non-contrast
Comparison: 05/30/2019

CLINICAL DATA: Follow-up right nephrolithiasis

EXAM:
RENAL / URINARY TRACT ULTRASOUND COMPLETE

[Series 1: us renal · 14 of 47 slices shown]
[im 1/47]
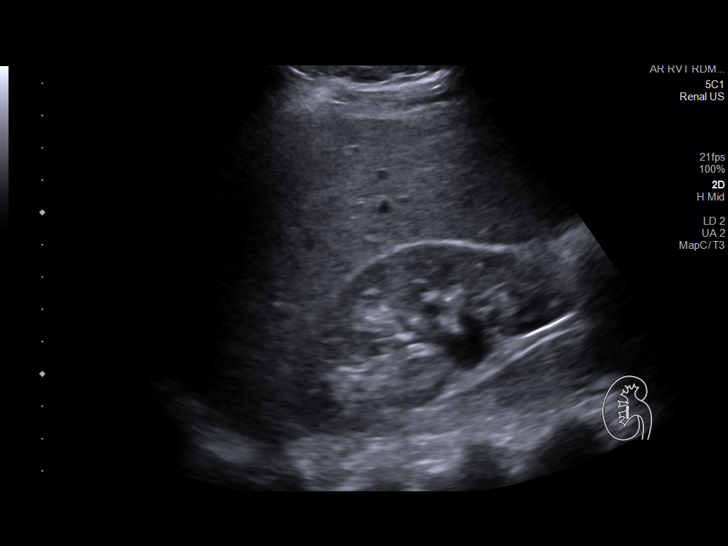
[im 4/47]
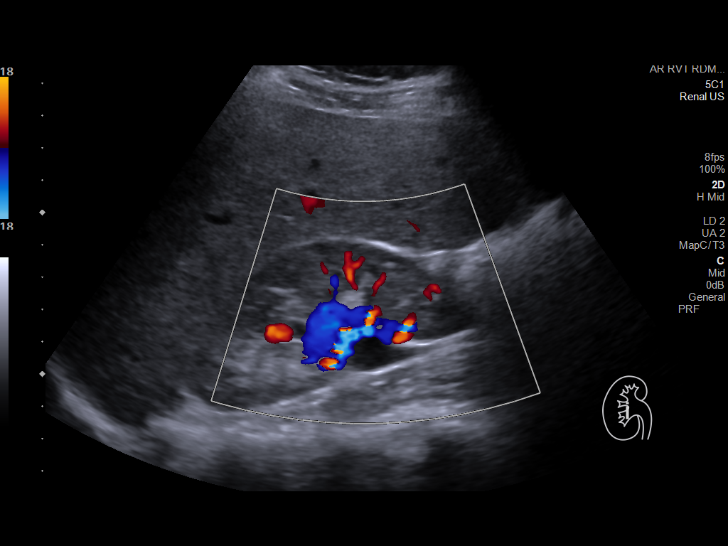
[im 8/47]
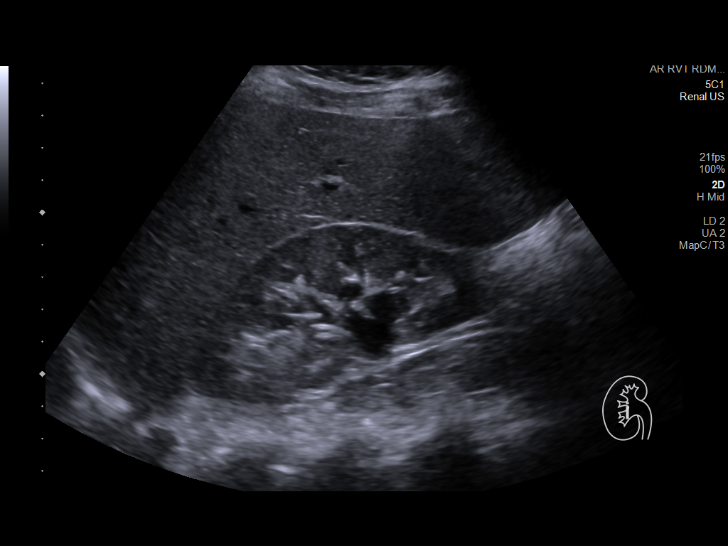
[im 12/47]
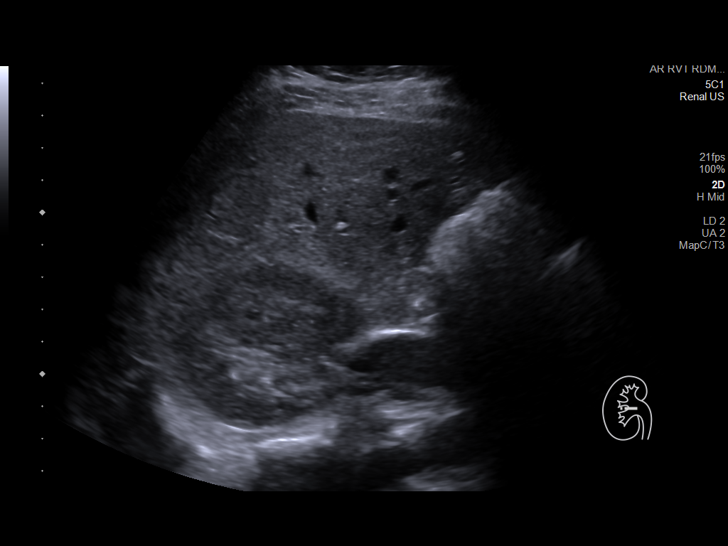
[im 16/47]
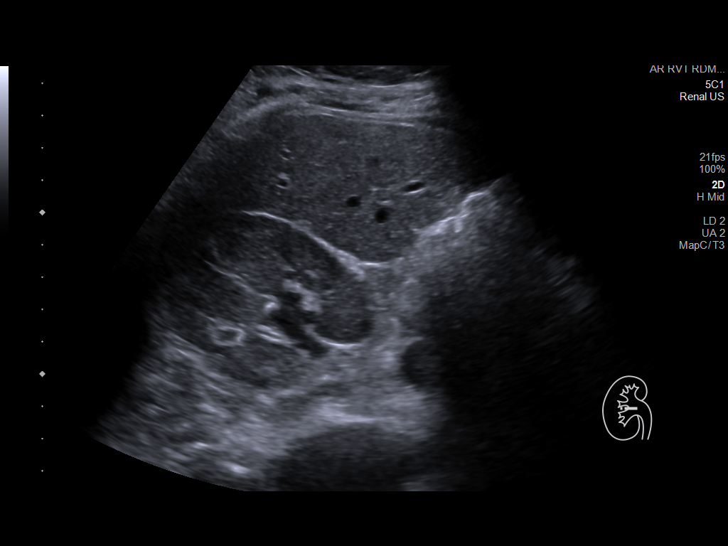
[im 18/47]
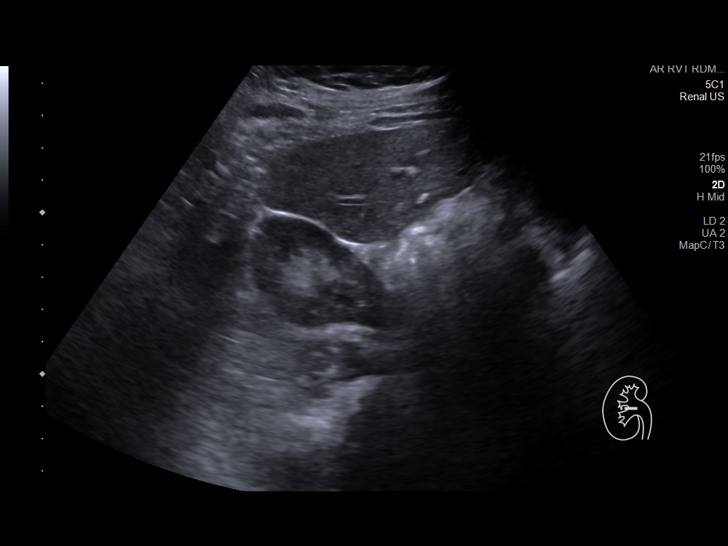
[im 22/47]
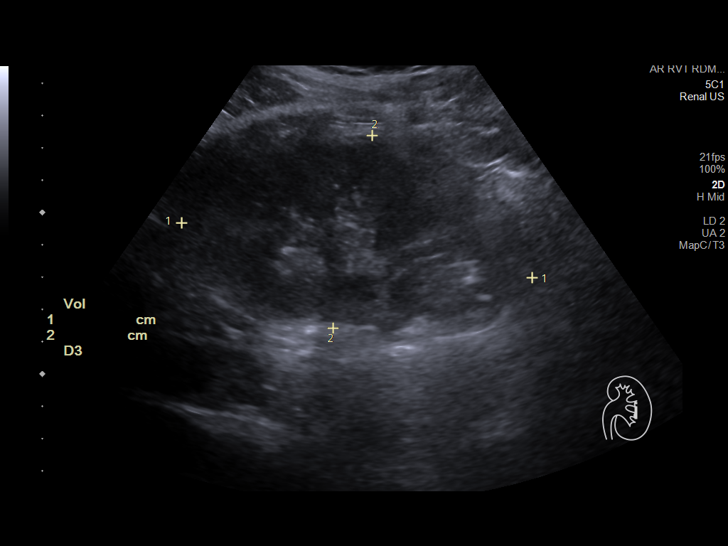
[im 25/47]
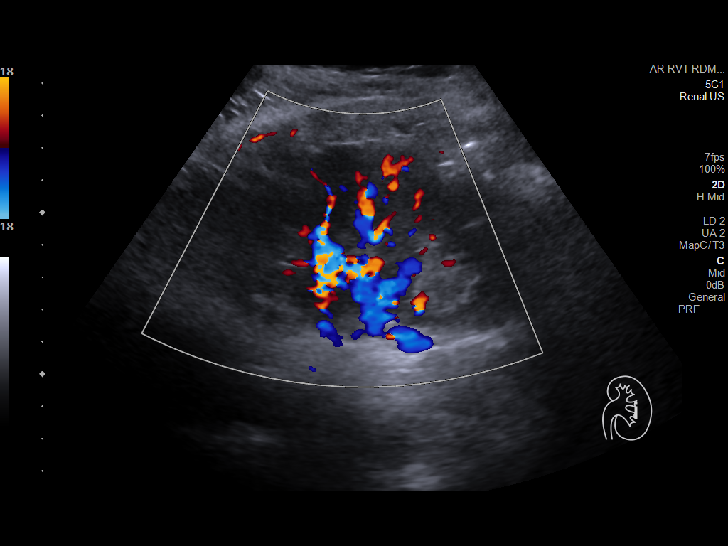
[im 29/47]
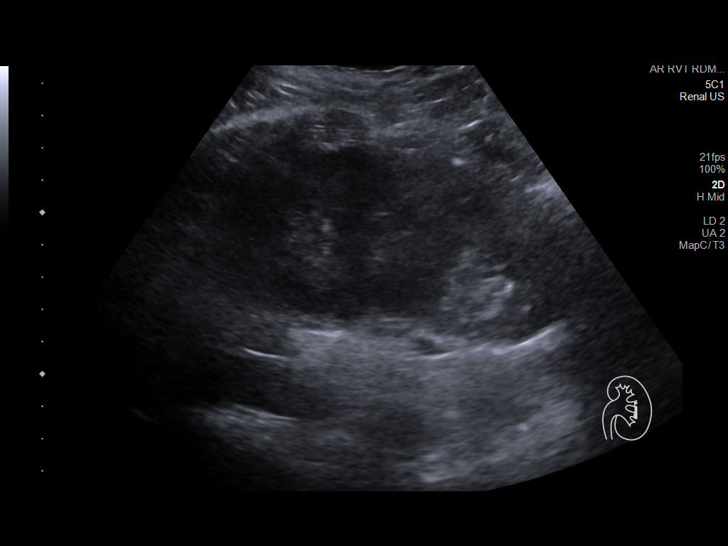
[im 31/47]
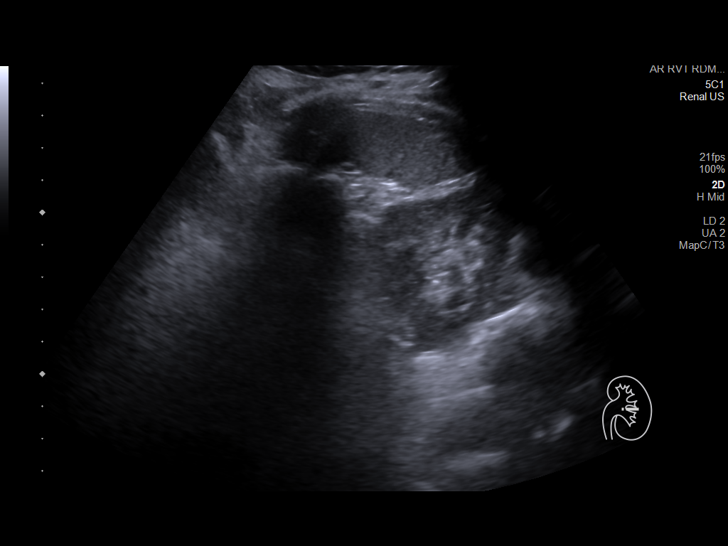
[im 35/47]
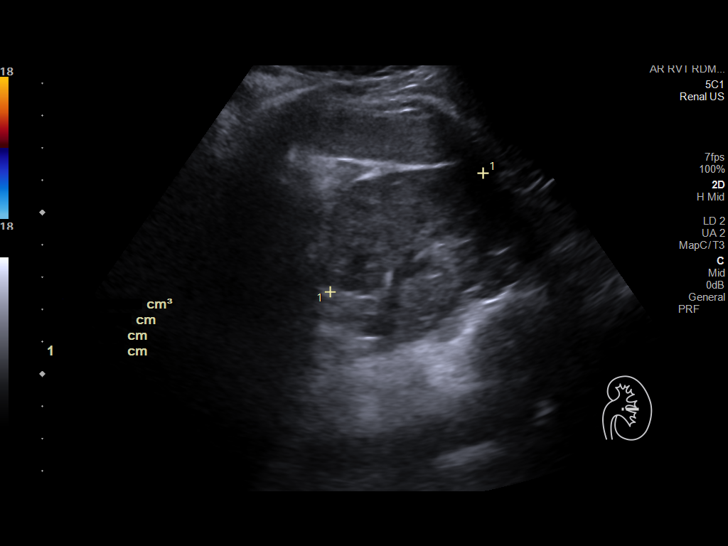
[im 39/47]
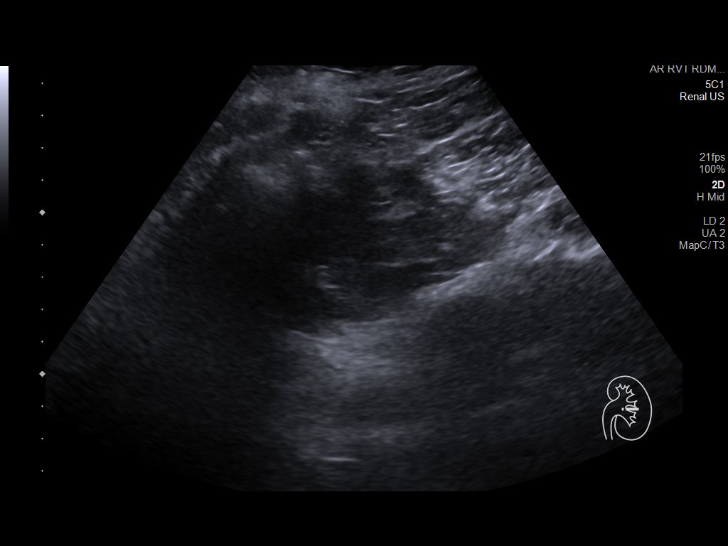
[im 43/47]
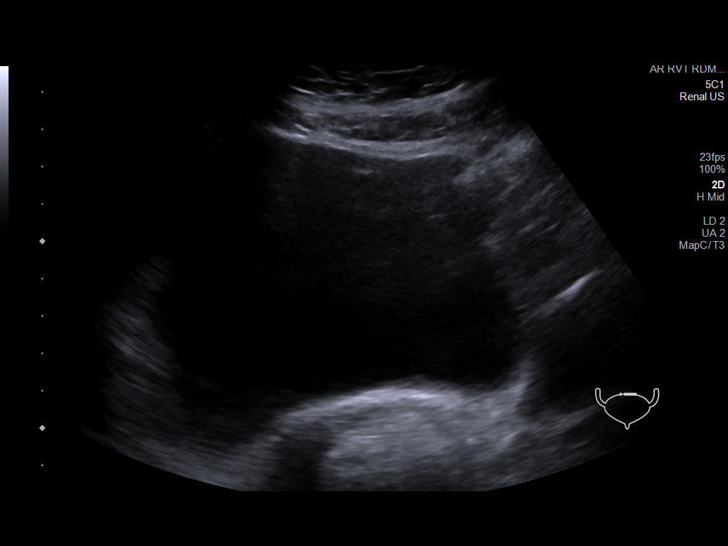
[im 47/47]
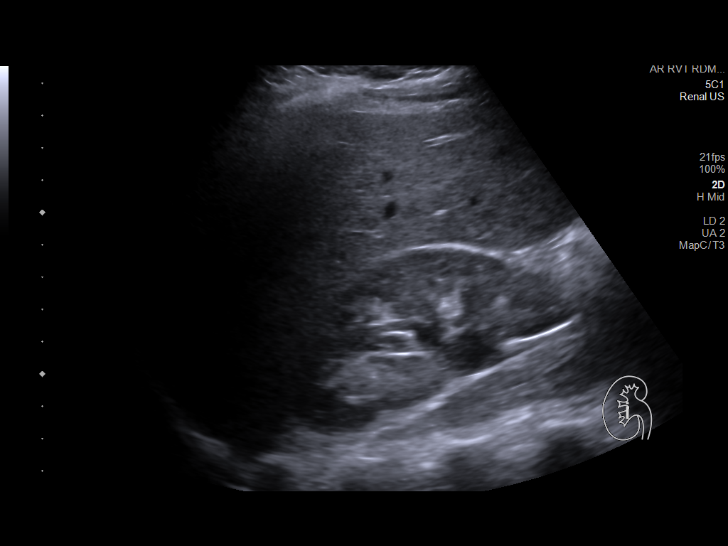

[14 of 25 positions shown; findings below may reference images not displayed]

FINDINGS: Right Kidney:

Renal measurements: 10.2 x 5.0 x 6.3 cm. = volume: 168 mL. Mild
fullness of the collecting system is noted similar to that seen on
prior CT examination. The right UPJ stone is not well appreciated on
this exam.

Left Kidney:

Renal measurements: 11.0 x 6.1 x 6.0 cm. = volume: 210 mL.
Echogenicity within normal limits. No mass or hydronephrosis
visualized.

Bladder:

Appears normal for degree of bladder distention. Bilateral ureteral
jets are noted.

Other:

None.
IMPRESSION: Mild fullness of the right renal collecting system. The previously
seen right UPJ stone is not well appreciated.

## 2021-10-31 ENCOUNTER — Other Ambulatory Visit (HOSPITAL_COMMUNITY): Payer: Self-pay | Admitting: Family Medicine

## 2021-10-31 DIAGNOSIS — M545 Low back pain, unspecified: Secondary | ICD-10-CM

## 2021-10-31 DIAGNOSIS — M546 Pain in thoracic spine: Secondary | ICD-10-CM

## 2021-10-31 DIAGNOSIS — Z1231 Encounter for screening mammogram for malignant neoplasm of breast: Secondary | ICD-10-CM

## 2021-11-21 ENCOUNTER — Ambulatory Visit (HOSPITAL_COMMUNITY)
Admission: RE | Admit: 2021-11-21 | Discharge: 2021-11-21 | Disposition: A | Discharge status: Home / Self Care | Payer: Managed Care, Other (non HMO) | Source: Ambulatory Visit | Attending: Family Medicine | Admitting: Family Medicine

## 2021-11-21 DIAGNOSIS — Z1231 Encounter for screening mammogram for malignant neoplasm of breast: Secondary | ICD-10-CM | POA: Insufficient documentation

## 2022-05-04 ENCOUNTER — Encounter: Payer: Self-pay | Admitting: Adult Health

## 2022-05-04 ENCOUNTER — Ambulatory Visit: Payer: No Typology Code available for payment source | Admitting: Adult Health

## 2022-05-04 VITALS — BP 120/67 | HR 67 | Ht 61.0 in | Wt 127.0 lb

## 2022-05-04 DIAGNOSIS — Z780 Asymptomatic menopausal state: Secondary | ICD-10-CM

## 2022-05-04 DIAGNOSIS — L853 Xerosis cutis: Secondary | ICD-10-CM | POA: Diagnosis not present

## 2022-05-04 DIAGNOSIS — N898 Other specified noninflammatory disorders of vagina: Secondary | ICD-10-CM | POA: Diagnosis not present

## 2022-05-04 DIAGNOSIS — Z79890 Hormone replacement therapy (postmenopausal): Secondary | ICD-10-CM

## 2022-05-04 DIAGNOSIS — R61 Generalized hyperhidrosis: Secondary | ICD-10-CM

## 2022-05-04 DIAGNOSIS — R232 Flushing: Secondary | ICD-10-CM | POA: Diagnosis not present

## 2022-05-04 DIAGNOSIS — R5383 Other fatigue: Secondary | ICD-10-CM

## 2022-05-04 MED ORDER — CLIMARA PRO 0.045-0.015 MG/DAY TD PTWK: 1.0000 | MEDICATED_PATCH | TRANSDERMAL | 6 refills | Status: DC

## 2022-05-04 NOTE — Progress Notes (Addendum)
Subjective:     Patient ID: [name] [name], female   DOB: 1965/09/28, 57 y.o.   MRN: [id number]  HPI Ova is a 57 year old white female, divorced, PM in complaining of hot flashes, dry vagina and skin ,and no energy.  Last pap was negative HPV, NILM 09/27/20  PCP is Dr Margo Aye  Review of Systems +hot flashes +night sweats,does not sleep good +vaginal dryness and dry skin No energy Denies any vaginal bleeding  Not sexually active   Reviewed past medical,surgical, social and family history. Reviewed medications and allergies.     Objective:   Physical Exam BP 120/67 (BP Location: Left Arm, Patient Position: Sitting, Cuff Size: Normal)   Pulse 67   Ht  (1.549 m)   Wt 127 lb (57.6 kg)   LMP 12/03/2017 (Approximate)   BMI 24.00 kg/m  Skin warm and dry. Lungs: clear to ausculation bilaterally. Cardiovascular: regular rate and rhythm.    Fall risk is low    05/04/2022   10:26 AM 05/22/2017   11:35 AM 05/16/2016    9:06 AM  Depression screen PHQ 2/9  Decreased Interest 0 0 0  Down, Depressed, Hopeless 1 0 0  PHQ - 2 Score 1 0 0  Altered sleeping 2    Tired, decreased energy 1    Change in appetite 0    Feeling bad or failure about yourself  0    Trouble concentrating 0    Moving slowly or fidgety/restless 0    Suicidal thoughts 0    PHQ-9 Score 4     She says she is good    05/04/2022   10:28 AM  GAD 7 : Generalized Anxiety Score  Nervous, Anxious, on Edge 0  Control/stop worrying 0  Worry too much - different things 0  Trouble relaxing 1  Restless 0  Easily annoyed or irritable 1  Afraid - awful might happen 0  Total GAD 7 Score 2    Upstream - 05/04/22 1025       Pregnancy Intention Screening   Does the patient want to become pregnant in the next year? N/A    Does the patient's partner want to become pregnant in the next year? N/A    Would the patient like to discuss contraceptive options today? N/A      Contraception Wrap Up   Current Method Abstinence    postmenopausal   End Method Abstinence   postmenopausal   Contraception Counseling Provided No               Assessment:     1. Hot flashes Will try climara pro, as she wants a patch   2. Night sweats  3. Vaginal dryness She has estrogen vaginal cream but does not use, says it makes it worse  4. Dry skin  5. No energy Had labs with PCP   6. Postmenopause Denies any vaginal bleeding  7. Hormone replacement therapy (HRT) Discussed HRT, has not had MI,stroke, DVT, breast cancer, does not smoke She wants to try a patcch Meds ordered this encounter  Medications   estradiol-levonorgestrel (CLIMARA PRO) 0.045-0.015 MG/DAY    Sig: Place 1 patch onto the skin once a week.    Dispense:  4 patch    Refill:  6    Order Specific Question:   Supervising Provider    Answer:   Lazaro Arms [[id number]]       Plan:     Follow up in 3 months for  ROS

## 2022-06-16 ENCOUNTER — Telehealth: Payer: Self-pay | Admitting: Adult Health

## 2022-06-16 NOTE — Telephone Encounter (Signed)
Patient used patch for 5 weeks and started having side effects; confusion, dizziness. She hasn't taken it for a week and is wanting to know what she should do. Please advise.

## 2022-06-16 NOTE — Telephone Encounter (Signed)
Left message to stay off the patch, will talk next week may try something else for hot flashes

## 2022-07-10 ENCOUNTER — Telehealth: Payer: Self-pay

## 2022-07-10 NOTE — Telephone Encounter (Signed)
Has hot flashes and vaginal dryness, like the patch but felt anxious, confused and had some dizziness too, so stopped that. Will call back tomorrow and discuss further option, should be off work after 2:30 pm

## 2022-07-10 NOTE — Telephone Encounter (Signed)
Patient called and stated that she needs to speak with Victorino Dike about her hormone levels.

## 2022-07-12 MED ORDER — IMVEXXY MAINTENANCE PACK 10 MCG VA INST: VAGINAL_INSERT | VAGINAL | Status: AC

## 2022-07-12 NOTE — Telephone Encounter (Signed)
Left message I have samples of imvexxy for her to try, come get and let me know if helps with vaginal dryness or not. (She told me the vaginal cream insert hurts and irritates her vagina

## 2022-07-12 NOTE — Addendum Note (Signed)
Addended by: Cyril Mourning A on: 07/12/2022 09:35 AM   Modules accepted: Orders

## 2022-08-03 ENCOUNTER — Ambulatory Visit: Payer: No Typology Code available for payment source | Admitting: Adult Health

## 2023-02-26 ENCOUNTER — Other Ambulatory Visit (HOSPITAL_COMMUNITY): Payer: Self-pay | Admitting: Family Medicine

## 2023-02-26 ENCOUNTER — Ambulatory Visit (HOSPITAL_COMMUNITY)
Admission: RE | Admit: 2023-02-26 | Discharge: 2023-02-26 | Disposition: A | Discharge status: Home / Self Care | Payer: No Typology Code available for payment source | Source: Ambulatory Visit | Attending: Family Medicine | Admitting: Family Medicine

## 2023-02-26 DIAGNOSIS — R52 Pain, unspecified: Secondary | ICD-10-CM | POA: Insufficient documentation

## 2023-03-02 ENCOUNTER — Encounter (INDEPENDENT_AMBULATORY_CARE_PROVIDER_SITE_OTHER): Payer: Self-pay | Admitting: *Deleted

## 2023-11-12 ENCOUNTER — Encounter: Payer: Self-pay | Admitting: *Deleted

## 2023-11-12 ENCOUNTER — Other Ambulatory Visit (HOSPITAL_COMMUNITY): Payer: Self-pay | Admitting: Nurse Practitioner

## 2023-11-12 DIAGNOSIS — R82998 Other abnormal findings in urine: Secondary | ICD-10-CM

## 2023-11-15 ENCOUNTER — Ambulatory Visit (HOSPITAL_COMMUNITY)

## 2023-11-15 ENCOUNTER — Encounter (HOSPITAL_COMMUNITY): Payer: Self-pay
